# Patient Record
Sex: Female | Born: 1955
Health system: Southern US, Community
[De-identification: ages and names within clinical notes are randomized; demographics above are authoritative.]

## PROBLEM LIST (undated history)

## (undated) DIAGNOSIS — G8929 Other chronic pain: Secondary | ICD-10-CM

## (undated) DIAGNOSIS — I10 Essential (primary) hypertension: Secondary | ICD-10-CM

## (undated) DIAGNOSIS — M549 Dorsalgia, unspecified: Secondary | ICD-10-CM

## (undated) DIAGNOSIS — K589 Irritable bowel syndrome without diarrhea: Secondary | ICD-10-CM

## (undated) DIAGNOSIS — E785 Hyperlipidemia, unspecified: Secondary | ICD-10-CM

## (undated) DIAGNOSIS — M5020 Other cervical disc displacement, unspecified cervical region: Secondary | ICD-10-CM

## (undated) DIAGNOSIS — Z800 Family history of malignant neoplasm of digestive organs: Secondary | ICD-10-CM

## (undated) DIAGNOSIS — F419 Anxiety disorder, unspecified: Secondary | ICD-10-CM

## (undated) DIAGNOSIS — H269 Unspecified cataract: Secondary | ICD-10-CM

## (undated) DIAGNOSIS — K219 Gastro-esophageal reflux disease without esophagitis: Secondary | ICD-10-CM

## (undated) DIAGNOSIS — Z1211 Encounter for screening for malignant neoplasm of colon: Secondary | ICD-10-CM

## (undated) DIAGNOSIS — F32A Depression, unspecified: Secondary | ICD-10-CM

## (undated) HISTORY — PX: BACK SURGERY: SHX140

## (undated) HISTORY — PX: DENTAL SURGERY: SHX609

## (undated) HISTORY — DX: Essential (primary) hypertension: I10

## (undated) HISTORY — DX: Unspecified cataract: H26.9

## (undated) HISTORY — DX: Gastro-esophageal reflux disease without esophagitis: K21.9

## (undated) HISTORY — DX: Depression, unspecified: F32.A

## (undated) HISTORY — PX: COLONOSCOPY, DIAGNOSTIC (SCREENING): SHX174

## (undated) HISTORY — DX: Hyperlipidemia, unspecified: E78.5

## (undated) HISTORY — DX: Encounter for screening for malignant neoplasm of colon: Z12.11

## (undated) HISTORY — DX: Anxiety disorder, unspecified: F41.9

## (undated) HISTORY — DX: Family history of malignant neoplasm of digestive organs: Z80.0

---

## 1973-10-28 HISTORY — PX: OTHER SURGICAL HISTORY: SHX169

## 1999-10-29 HISTORY — PX: REDUCTION MAMMAPLASTY: SUR839

## 2006-02-13 ENCOUNTER — Other Ambulatory Visit: Admission: RE | Admit: 2006-02-13 | Discharge: 2006-02-13 | Payer: Self-pay | Admitting: Obstetrics & Gynecology

## 2006-08-04 ENCOUNTER — Emergency Department (HOSPITAL_COMMUNITY): Admission: EM | Admit: 2006-08-04 | Discharge: 2006-08-04 | Payer: Self-pay | Admitting: Emergency Medicine

## 2007-05-28 ENCOUNTER — Other Ambulatory Visit: Admission: RE | Admit: 2007-05-28 | Discharge: 2007-05-28 | Payer: Self-pay | Admitting: Obstetrics & Gynecology

## 2007-06-04 ENCOUNTER — Emergency Department (HOSPITAL_COMMUNITY): Admission: EM | Admit: 2007-06-04 | Discharge: 2007-06-04 | Payer: Self-pay | Admitting: Emergency Medicine

## 2008-07-14 ENCOUNTER — Other Ambulatory Visit: Admission: RE | Admit: 2008-07-14 | Discharge: 2008-07-14 | Payer: Self-pay | Admitting: Obstetrics & Gynecology

## 2009-11-23 ENCOUNTER — Ambulatory Visit: Payer: Self-pay | Admitting: Family Medicine

## 2011-08-12 LAB — DIFFERENTIAL
Basophils Absolute: 0
Basophils Relative: 1
Eosinophils Absolute: 0.1
Eosinophils Relative: 1
Neutrophils Relative %: 43

## 2011-08-12 LAB — CBC
HCT: 34.9 — ABNORMAL LOW
MCHC: 32.8
MCV: 79.1
Platelets: 288
RDW: 15.3 — ABNORMAL HIGH
WBC: 6.3

## 2011-08-12 LAB — POCT I-STAT CREATININE: Operator id: 294521

## 2011-08-12 LAB — I-STAT 8, (EC8 V) (CONVERTED LAB)
Acid-Base Excess: 5 — ABNORMAL HIGH
BUN: 13
Bicarbonate: 32 — ABNORMAL HIGH
HCT: 39
Hemoglobin: 13.3
Operator id: 294521
Sodium: 140
TCO2: 34
pCO2, Ven: 59.1 — ABNORMAL HIGH

## 2012-04-24 DIAGNOSIS — M5020 Other cervical disc displacement, unspecified cervical region: Secondary | ICD-10-CM | POA: Insufficient documentation

## 2013-06-11 DIAGNOSIS — Z5329 Procedure and treatment not carried out because of patient's decision for other reasons: Secondary | ICD-10-CM | POA: Insufficient documentation

## 2013-06-11 DIAGNOSIS — E7800 Pure hypercholesterolemia, unspecified: Secondary | ICD-10-CM | POA: Insufficient documentation

## 2013-06-11 DIAGNOSIS — E559 Vitamin D deficiency, unspecified: Secondary | ICD-10-CM | POA: Insufficient documentation

## 2013-06-11 DIAGNOSIS — H11119 Conjunctival deposits, unspecified eye: Secondary | ICD-10-CM | POA: Insufficient documentation

## 2013-06-11 DIAGNOSIS — N952 Postmenopausal atrophic vaginitis: Secondary | ICD-10-CM | POA: Insufficient documentation

## 2013-06-20 DIAGNOSIS — M549 Dorsalgia, unspecified: Secondary | ICD-10-CM | POA: Insufficient documentation

## 2013-07-23 DIAGNOSIS — N949 Unspecified condition associated with female genital organs and menstrual cycle: Secondary | ICD-10-CM | POA: Insufficient documentation

## 2013-09-29 ENCOUNTER — Other Ambulatory Visit: Payer: Self-pay | Admitting: Neurosurgery

## 2013-09-29 DIAGNOSIS — M5416 Radiculopathy, lumbar region: Secondary | ICD-10-CM

## 2013-09-29 DIAGNOSIS — M5412 Radiculopathy, cervical region: Secondary | ICD-10-CM

## 2013-10-08 ENCOUNTER — Ambulatory Visit
Admission: RE | Admit: 2013-10-08 | Discharge: 2013-10-08 | Disposition: A | Discharge status: Home / Self Care | Payer: BC Managed Care – PPO | Source: Ambulatory Visit | Attending: Neurosurgery | Admitting: Neurosurgery

## 2013-10-08 VITALS — BP 151/78 | HR 69

## 2013-10-08 DIAGNOSIS — M5412 Radiculopathy, cervical region: Secondary | ICD-10-CM

## 2013-10-08 DIAGNOSIS — M5416 Radiculopathy, lumbar region: Secondary | ICD-10-CM

## 2013-10-08 MED ORDER — MEPERIDINE HCL 100 MG/ML IJ SOLN
100.0000 mg | Freq: Once | INTRAMUSCULAR | Status: AC
  Administered 2013-10-08: 100 mg via INTRAMUSCULAR

## 2013-10-08 MED ORDER — DIAZEPAM 5 MG PO TABS
10.0000 mg | ORAL_TABLET | Freq: Once | ORAL | Status: AC
  Administered 2013-10-08: 10 mg via ORAL

## 2013-10-08 MED ORDER — ONDANSETRON HCL 4 MG/2ML IJ SOLN: 4.0000 mg | Freq: Four times a day (QID) | INTRAMUSCULAR | Status: DC | PRN

## 2013-10-08 MED ORDER — IOHEXOL 300 MG/ML  SOLN: 9.0000 mL | Freq: Once | INTRAMUSCULAR | Status: DC | PRN

## 2013-10-08 MED ORDER — ONDANSETRON HCL 4 MG/2ML IJ SOLN
4.0000 mg | Freq: Once | INTRAMUSCULAR | Status: AC
  Administered 2013-10-08: 4 mg via INTRAMUSCULAR

## 2013-10-08 NOTE — Progress Notes (Signed)
Pt is very anxious about myelogram, tried to reassure her that the procedure will go well and give her dr. the info he needs to help her.

## 2013-10-19 ENCOUNTER — Ambulatory Visit: Payer: BC Managed Care – PPO | Admitting: Family Medicine

## 2013-10-20 ENCOUNTER — Ambulatory Visit: Payer: BC Managed Care – PPO | Admitting: Family Medicine

## 2013-10-25 ENCOUNTER — Other Ambulatory Visit: Payer: Self-pay | Admitting: Neurosurgery

## 2013-10-25 DIAGNOSIS — M545 Low back pain: Secondary | ICD-10-CM

## 2013-10-27 ENCOUNTER — Ambulatory Visit
Admission: RE | Admit: 2013-10-27 | Discharge: 2013-10-27 | Disposition: A | Discharge status: Home / Self Care | Payer: BC Managed Care – PPO | Source: Ambulatory Visit | Attending: Neurosurgery | Admitting: Neurosurgery

## 2013-10-27 DIAGNOSIS — M545 Low back pain: Secondary | ICD-10-CM

## 2013-10-27 MED ORDER — IOHEXOL 180 MG/ML  SOLN
1.0000 mL | Freq: Once | INTRAMUSCULAR | Status: AC | PRN
  Administered 2013-10-27: 1 mL via EPIDURAL

## 2013-10-27 MED ORDER — METHYLPREDNISOLONE ACETATE 40 MG/ML INJ SUSP (RADIOLOG
120.0000 mg | Freq: Once | INTRAMUSCULAR | Status: AC
  Administered 2013-10-27: 120 mg via EPIDURAL

## 2013-12-08 ENCOUNTER — Other Ambulatory Visit: Payer: Self-pay | Admitting: Neurosurgery

## 2013-12-08 DIAGNOSIS — M545 Low back pain, unspecified: Secondary | ICD-10-CM

## 2013-12-11 ENCOUNTER — Encounter (HOSPITAL_COMMUNITY): Payer: Self-pay | Admitting: Emergency Medicine

## 2013-12-11 ENCOUNTER — Emergency Department (HOSPITAL_COMMUNITY): Payer: BC Managed Care – PPO

## 2013-12-11 ENCOUNTER — Emergency Department (HOSPITAL_COMMUNITY)
Admission: EM | Admit: 2013-12-11 | Discharge: 2013-12-11 | Disposition: A | Discharge status: Home / Self Care | Payer: BC Managed Care – PPO | Attending: Emergency Medicine | Admitting: Emergency Medicine

## 2013-12-11 DIAGNOSIS — G8929 Other chronic pain: Secondary | ICD-10-CM | POA: Insufficient documentation

## 2013-12-11 DIAGNOSIS — R42 Dizziness and giddiness: Secondary | ICD-10-CM | POA: Insufficient documentation

## 2013-12-11 DIAGNOSIS — R110 Nausea: Secondary | ICD-10-CM | POA: Insufficient documentation

## 2013-12-11 DIAGNOSIS — R0789 Other chest pain: Secondary | ICD-10-CM

## 2013-12-11 DIAGNOSIS — M549 Dorsalgia, unspecified: Secondary | ICD-10-CM | POA: Insufficient documentation

## 2013-12-11 DIAGNOSIS — Z79899 Other long term (current) drug therapy: Secondary | ICD-10-CM | POA: Insufficient documentation

## 2013-12-11 DIAGNOSIS — I10 Essential (primary) hypertension: Secondary | ICD-10-CM | POA: Insufficient documentation

## 2013-12-11 HISTORY — DX: Other chronic pain: G89.29

## 2013-12-11 HISTORY — DX: Essential (primary) hypertension: I10

## 2013-12-11 HISTORY — DX: Dorsalgia, unspecified: M54.9

## 2013-12-11 LAB — CBC
HCT: 38.3 % (ref 36.0–46.0)
Hemoglobin: 12.3 g/dL (ref 12.0–15.0)
MCH: 27.6 pg (ref 26.0–34.0)
MCHC: 32.1 g/dL (ref 30.0–36.0)
MCV: 86.1 fL (ref 78.0–100.0)
Platelets: 240 10*3/uL (ref 150–400)
RBC: 4.45 MIL/uL (ref 3.87–5.11)
RDW: 12.7 % (ref 11.5–15.5)
WBC: 6.2 10*3/uL (ref 4.0–10.5)

## 2013-12-11 LAB — BASIC METABOLIC PANEL
BUN: 11 mg/dL (ref 6–23)
CHLORIDE: 97 meq/L (ref 96–112)
CO2: 29 mEq/L (ref 19–32)
Calcium: 10 mg/dL (ref 8.4–10.5)
Creatinine, Ser: 0.95 mg/dL (ref 0.50–1.10)
GFR calc Af Amer: 75 mL/min — ABNORMAL LOW (ref >90)
GFR calc non Af Amer: 64 mL/min — ABNORMAL LOW (ref >90)
Glucose, Bld: 110 mg/dL — ABNORMAL HIGH (ref 70–99)
POTASSIUM: 3.5 meq/L — AB (ref 3.7–5.3)
SODIUM: 138 meq/L (ref 137–147)

## 2013-12-11 LAB — POCT I-STAT TROPONIN I: Troponin i, poc: 0 ng/mL (ref 0.00–0.08)

## 2013-12-11 MED ORDER — PROMETHAZINE HCL 25 MG PO TABS: 25.0000 mg | ORAL_TABLET | Freq: Four times a day (QID) | ORAL | Status: DC | PRN

## 2013-12-11 MED ORDER — MECLIZINE HCL 50 MG PO TABS: 25.0000 mg | ORAL_TABLET | Freq: Three times a day (TID) | ORAL | Status: DC | PRN

## 2013-12-11 MED ORDER — MECLIZINE HCL 25 MG PO TABS
25.0000 mg | ORAL_TABLET | Freq: Once | ORAL | Status: AC
  Administered 2013-12-11: 25 mg via ORAL
  Filled 2013-12-11: qty 1

## 2013-12-11 NOTE — ED Notes (Signed)
Pt ambulatory to restroom with no issues 
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
## 2013-12-11 NOTE — Discharge Instructions (Signed)
1. Medications: antivert, phenergan, usual home medications 2. Treatment: rest, drink plenty of fluids,  3. Follow Up: Please followup with your primary doctor for discussion of your diagnoses and further evaluation after today's visit;    Benign Positional Vertigo Vertigo means you feel like you or your surroundings are moving when they are not. Benign positional vertigo is the most common form of vertigo. Benign means that the cause of your condition is not serious. Benign positional vertigo is more common in older adults. CAUSES  Benign positional vertigo is the result of an upset in the labyrinth system. This is an area in the middle ear that helps control your balance. This may be caused by a viral infection, head injury, or repetitive motion. However, often no specific cause is found. SYMPTOMS  Symptoms of benign positional vertigo occur when you move your head or eyes in different directions. Some of the symptoms may include:  Loss of balance and falls.  Vomiting.  Blurred vision.  Dizziness.  Nausea.  Involuntary eye movements (nystagmus). DIAGNOSIS  Benign positional vertigo is usually diagnosed by physical exam. If the specific cause of your benign positional vertigo is unknown, your caregiver may perform imaging tests, such as magnetic resonance imaging (MRI) or computed tomography (CT). TREATMENT  Your caregiver may recommend movements or procedures to correct the benign positional vertigo. Medicines such as meclizine, benzodiazepines, and medicines for nausea may be used to treat your symptoms. In rare cases, if your symptoms are caused by certain conditions that affect the inner ear, you may need surgery. HOME CARE INSTRUCTIONS   Follow your caregiver's instructions.  Move slowly. Do not make sudden body or head movements.  Avoid driving.  Avoid operating heavy machinery.  Avoid performing any tasks that would be dangerous to you or others during a vertigo  episode.  Drink enough fluids to keep your urine clear or pale yellow. SEEK IMMEDIATE MEDICAL CARE IF:   You develop problems with walking, weakness, numbness, or using your arms, hands, or legs.  You have difficulty speaking.  You develop severe headaches.  Your nausea or vomiting continues or gets worse.  You develop visual changes.  Your family or friends notice any behavioral changes.  Your condition gets worse.  You have a fever.  You develop a stiff neck or sensitivity to light. MAKE SURE YOU:   Understand these instructions.  Will watch your condition.  Will get help right away if you are not doing well or get worse. Document Released: 07/22/2006 Document Revised: 01/06/2012 Document Reviewed: 07/04/2011 [facility] Patient Information 2014 [place], [place].

## 2013-12-11 NOTE — ED Notes (Signed)
Pt in c/o dizziness and feeling lightheaded that started after taking tramadol yesterday, states she has been taking a variety of medications over the last week to control sciatic pain without relief, but patient is here today due to feeling concerned about the dizziness. States symptoms have been constant since they started. Also c/o chest and back soreness, states she slept on the floor last night due to her back pain so she wasn't sure what caused it.

## 2013-12-11 NOTE — ED Notes (Signed)
Pt did not complain of dizziness or chest pain until RN arrived at bedside and asked many questions in triage, EKG obtained once complaint was made

## 2013-12-11 NOTE — ED Notes (Signed)
Pt returned from CT °

## 2013-12-12 NOTE — ED Provider Notes (Signed)
Medical screening examination/treatment/procedure(s) were performed by non-physician practitioner and as supervising physician I was immediately available for consultation/collaboration.  EKG Interpretation    Date/Time:  Saturday December 11 2013 18:21:35 EST Ventricular Rate:  84 PR Interval:  174 QRS Duration: 76 QT Interval:  370 QTC Calculation: 437 R Axis:   30 Text Interpretation:  Normal sinus rhythm Septal infarct , age undetermined No significant change since last tracing Confirmed by Anitra LauthPLUNKETT  MD, Pasco Marchitto ([id number]) on 12/11/2013 6:40:01 PM              Gwyneth SproutWhitney Kenitra Leventhal, MD 12/12/13 1207

## 2013-12-30 ENCOUNTER — Ambulatory Visit
Admission: RE | Admit: 2013-12-30 | Discharge: 2013-12-30 | Disposition: A | Discharge status: Home / Self Care | Payer: BC Managed Care – PPO | Source: Ambulatory Visit | Attending: Neurosurgery | Admitting: Neurosurgery

## 2013-12-30 ENCOUNTER — Other Ambulatory Visit: Payer: Self-pay | Admitting: Neurosurgery

## 2013-12-30 DIAGNOSIS — M545 Low back pain, unspecified: Secondary | ICD-10-CM

## 2013-12-30 MED ORDER — IOHEXOL 180 MG/ML  SOLN
1.0000 mL | Freq: Once | INTRAMUSCULAR | Status: AC | PRN
  Administered 2013-12-30: 1 mL via EPIDURAL

## 2013-12-30 MED ORDER — METHYLPREDNISOLONE ACETATE 40 MG/ML INJ SUSP (RADIOLOG
120.0000 mg | Freq: Once | INTRAMUSCULAR | Status: AC
  Administered 2013-12-30: 120 mg via EPIDURAL

## 2014-01-19 DIAGNOSIS — M5414 Radiculopathy, thoracic region: Secondary | ICD-10-CM | POA: Insufficient documentation

## 2014-01-19 DIAGNOSIS — Z789 Other specified health status: Secondary | ICD-10-CM | POA: Insufficient documentation

## 2014-01-19 DIAGNOSIS — M542 Cervicalgia: Secondary | ICD-10-CM | POA: Insufficient documentation

## 2014-01-19 DIAGNOSIS — G4700 Insomnia, unspecified: Secondary | ICD-10-CM | POA: Insufficient documentation

## 2014-01-19 DIAGNOSIS — M5417 Radiculopathy, lumbosacral region: Principal | ICD-10-CM

## 2014-01-19 DIAGNOSIS — Z6826 Body mass index (BMI) 26.0-26.9, adult: Secondary | ICD-10-CM | POA: Insufficient documentation

## 2014-02-14 ENCOUNTER — Other Ambulatory Visit: Payer: Self-pay | Admitting: Neurosurgery

## 2014-02-14 DIAGNOSIS — M545 Low back pain, unspecified: Secondary | ICD-10-CM

## 2014-02-24 ENCOUNTER — Other Ambulatory Visit: Payer: Self-pay | Admitting: Neurosurgery

## 2014-02-24 ENCOUNTER — Ambulatory Visit
Admission: RE | Admit: 2014-02-24 | Discharge: 2014-02-24 | Disposition: A | Discharge status: Home / Self Care | Payer: BC Managed Care – PPO | Source: Ambulatory Visit | Attending: Neurosurgery | Admitting: Neurosurgery

## 2014-02-24 DIAGNOSIS — M545 Low back pain, unspecified: Secondary | ICD-10-CM

## 2014-02-24 MED ORDER — METHYLPREDNISOLONE ACETATE 40 MG/ML INJ SUSP (RADIOLOG
120.0000 mg | Freq: Once | INTRAMUSCULAR | Status: AC
  Administered 2014-02-24: 120 mg via EPIDURAL

## 2014-02-24 MED ORDER — IOHEXOL 180 MG/ML  SOLN
1.0000 mL | Freq: Once | INTRAMUSCULAR | Status: AC | PRN
  Administered 2014-02-24: 1 mL via EPIDURAL

## 2014-05-17 DIAGNOSIS — Z0000 Encounter for general adult medical examination without abnormal findings: Secondary | ICD-10-CM | POA: Insufficient documentation

## 2014-07-12 DIAGNOSIS — I10 Essential (primary) hypertension: Secondary | ICD-10-CM | POA: Insufficient documentation

## 2014-09-19 DIAGNOSIS — R0683 Snoring: Secondary | ICD-10-CM | POA: Insufficient documentation

## 2014-10-03 DIAGNOSIS — J309 Allergic rhinitis, unspecified: Secondary | ICD-10-CM | POA: Insufficient documentation

## 2015-02-10 ENCOUNTER — Emergency Department (HOSPITAL_BASED_OUTPATIENT_CLINIC_OR_DEPARTMENT_OTHER): Payer: No Typology Code available for payment source

## 2015-02-10 ENCOUNTER — Encounter (HOSPITAL_BASED_OUTPATIENT_CLINIC_OR_DEPARTMENT_OTHER): Payer: Self-pay

## 2015-02-10 ENCOUNTER — Emergency Department (HOSPITAL_BASED_OUTPATIENT_CLINIC_OR_DEPARTMENT_OTHER)
Admission: EM | Admit: 2015-02-10 | Discharge: 2015-02-10 | Disposition: A | Discharge status: Home / Self Care | Payer: No Typology Code available for payment source | Attending: Emergency Medicine | Admitting: Emergency Medicine

## 2015-02-10 DIAGNOSIS — G8929 Other chronic pain: Secondary | ICD-10-CM | POA: Diagnosis not present

## 2015-02-10 DIAGNOSIS — Y9389 Activity, other specified: Secondary | ICD-10-CM | POA: Insufficient documentation

## 2015-02-10 DIAGNOSIS — Y92410 Unspecified street and highway as the place of occurrence of the external cause: Secondary | ICD-10-CM | POA: Insufficient documentation

## 2015-02-10 DIAGNOSIS — S199XXA Unspecified injury of neck, initial encounter: Secondary | ICD-10-CM | POA: Diagnosis present

## 2015-02-10 DIAGNOSIS — I10 Essential (primary) hypertension: Secondary | ICD-10-CM | POA: Diagnosis not present

## 2015-02-10 DIAGNOSIS — Y998 Other external cause status: Secondary | ICD-10-CM | POA: Diagnosis not present

## 2015-02-10 DIAGNOSIS — S46811A Strain of other muscles, fascia and tendons at shoulder and upper arm level, right arm, initial encounter: Secondary | ICD-10-CM

## 2015-02-10 DIAGNOSIS — Z79899 Other long term (current) drug therapy: Secondary | ICD-10-CM | POA: Diagnosis not present

## 2015-02-10 DIAGNOSIS — S46812A Strain of other muscles, fascia and tendons at shoulder and upper arm level, left arm, initial encounter: Secondary | ICD-10-CM | POA: Insufficient documentation

## 2015-02-10 MED ORDER — IBUPROFEN 800 MG PO TABS: 800.0000 mg | ORAL_TABLET | Freq: Three times a day (TID) | ORAL | Status: AC

## 2015-02-10 MED ORDER — CYCLOBENZAPRINE HCL 10 MG PO TABS
10.0000 mg | ORAL_TABLET | Freq: Two times a day (BID) | ORAL | Status: AC | PRN
Start: 2015-02-10 — End: ?
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
## 2015-02-10 NOTE — Discharge Instructions (Signed)
Take Ibuprofen and flexeril as needed for pain and muscle spasm. Refer to attached documents for more information. Return to the ED with worsening or concerning symptoms.

## 2015-03-30 DIAGNOSIS — G479 Sleep disorder, unspecified: Secondary | ICD-10-CM | POA: Insufficient documentation
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
## 2016-01-14 ENCOUNTER — Encounter (HOSPITAL_COMMUNITY): Payer: Self-pay | Admitting: Emergency Medicine

## 2016-01-14 ENCOUNTER — Emergency Department (HOSPITAL_COMMUNITY)
Admission: EM | Admit: 2016-01-14 | Discharge: 2016-01-14 | Disposition: A | Discharge status: Home / Self Care | Payer: BLUE CROSS/BLUE SHIELD | Attending: Emergency Medicine | Admitting: Emergency Medicine

## 2016-01-14 DIAGNOSIS — G8929 Other chronic pain: Secondary | ICD-10-CM | POA: Diagnosis not present

## 2016-01-14 DIAGNOSIS — I10 Essential (primary) hypertension: Secondary | ICD-10-CM | POA: Diagnosis not present

## 2016-01-14 DIAGNOSIS — Z79899 Other long term (current) drug therapy: Secondary | ICD-10-CM | POA: Insufficient documentation

## 2016-01-14 DIAGNOSIS — Z8719 Personal history of other diseases of the digestive system: Secondary | ICD-10-CM | POA: Diagnosis not present

## 2016-01-14 DIAGNOSIS — M545 Low back pain: Secondary | ICD-10-CM | POA: Diagnosis present

## 2016-01-14 DIAGNOSIS — M5410 Radiculopathy, site unspecified: Secondary | ICD-10-CM

## 2016-01-14 DIAGNOSIS — Z791 Long term (current) use of non-steroidal anti-inflammatories (NSAID): Secondary | ICD-10-CM | POA: Diagnosis not present

## 2016-01-14 DIAGNOSIS — M5416 Radiculopathy, lumbar region: Secondary | ICD-10-CM | POA: Diagnosis not present

## 2016-01-14 HISTORY — DX: Irritable bowel syndrome without diarrhea: K58.9

## 2016-01-14 HISTORY — DX: Other cervical disc displacement, unspecified cervical region: M50.20

## 2016-01-14 MED ORDER — PREDNISONE 50 MG PO TABS: ORAL_TABLET | ORAL | Status: DC

## 2016-01-14 MED ORDER — NAPROXEN 500 MG PO TABS: 500.0000 mg | ORAL_TABLET | Freq: Two times a day (BID) | ORAL | Status: AC

## 2016-01-14 MED ORDER — OXYCODONE-ACETAMINOPHEN 5-325 MG PO TABS: 1.0000 | ORAL_TABLET | ORAL | Status: AC | PRN

## 2016-01-14 MED ORDER — KETOROLAC TROMETHAMINE 60 MG/2ML IM SOLN
60.0000 mg | Freq: Once | INTRAMUSCULAR | Status: AC
  Administered 2016-01-14: 60 mg via INTRAMUSCULAR
  Filled 2016-01-14: qty 2

## 2016-01-14 NOTE — ED Notes (Signed)
Patient to remain in ED until 1015 due to IM injection.

## 2016-01-14 NOTE — ED Notes (Signed)
Pt states Friday her back pain began getting worse. Hx of sciatica, IBS, and slipped disks. States she didn't do anything to stress or pull her back. States the pain is on her right side/flank and radiates all the way into her right foot.

## 2016-01-14 NOTE — ED Provider Notes (Signed)
CSN: [id number]     Arrival date & time 01/14/16  0830 History   First MD Initiated Contact with Patient 01/14/16 [phone]     Chief Complaint  Patient presents with  . Back Pain     (Consider location/radiation/quality/duration/timing/severity/associated sxs/prior Treatment) HPI.Marland Kitchen.Marland Kitchen.Marland Kitchen.Right lower back pain with radiation to right leg and foot for 3 years, but getting worse since Friday.   She has been evaluated by a neurosurgeon and has been told she has a herniated disc 2. No bowel or bladder incontinence. Pain is worse with ambulation. Severity is moderate.  Past Medical History  Diagnosis Date  . Hypertension   . Back pain, chronic   . IBS (irritable bowel syndrome)   . Slipped cervical disc    History reviewed. No pertinent past surgical history. History reviewed. No pertinent family history. Social History  Substance Use Topics  . Smoking status: Never Smoker   . Smokeless tobacco: Never Used  . Alcohol Use: No   OB History    No data available     Review of Systems  All other systems reviewed and are negative.     Allergies  Review of patient's allergies indicates no known allergies.  Home Medications   Prior to Admission medications   Medication Sig Start Date End Date Taking? Authorizing Provider  cyclobenzaprine (FLEXERIL) 10 MG tablet Take 1 tablet (10 mg total) by mouth 2 (two) times daily as needed for muscle spasms. 02/10/15   Kaitlyn Szekalski, PA-C  hydrochlorothiazide (HYDRODIURIL) 12.5 MG tablet Take 12.5 mg by mouth daily.    Historical Provider, MD  ibuprofen (ADVIL,MOTRIN) 800 MG tablet Take 1 tablet (800 mg total) by mouth 3 (three) times daily. 02/10/15   Kaitlyn Szekalski, PA-C  meclizine (ANTIVERT) 50 MG tablet Take 0.5-1 tablets (25-50 mg total) by mouth 3 (three) times daily as needed for dizziness. 12/11/13   Hannah Muthersbaugh, PA-C  naproxen (NAPROSYN) 500 MG tablet Take 1 tablet (500 mg total) by mouth 2 (two) times daily. 01/14/16   Donnetta HutchingBrian Kyjuan Gause,  MD  ondansetron (ZOFRAN-ODT) 8 MG disintegrating tablet Take 8 mg by mouth every 8 (eight) hours as needed for nausea or vomiting.    Historical Provider, MD  oxyCODONE-acetaminophen (PERCOCET) 5-325 MG tablet Take 1-2 tablets by mouth every 4 (four) hours as needed. 01/14/16   Donnetta HutchingBrian Daray Polgar, MD  predniSONE (DELTASONE) 50 MG tablet 1 tablet for 6 days, one half tablet for 6 days 01/14/16   Donnetta HutchingBrian Nakyia Dau, MD  promethazine (PHENERGAN) 25 MG tablet Take 1 tablet (25 mg total) by mouth every 6 (six) hours as needed for nausea or vomiting. 12/11/13   Dahlia ClientHannah Muthersbaugh, PA-C  traMADol (ULTRAM) 50 MG tablet Take 50-100 mg by mouth every 6 (six) hours as needed for moderate pain.    Historical Provider, MD   BP 143/92 mmHg  Pulse 76  Temp(Src) 98 F (36.7 C) (Oral)  Resp 18  SpO2 100% Physical Exam  Constitutional: She is oriented to person, place, and time. She appears well-developed and well-nourished.  HENT:  Head: Normocephalic and atraumatic.  Eyes: Conjunctivae and EOM are normal. Pupils are equal, round, and reactive to light.  Neck: Normal range of motion. Neck supple.  Cardiovascular: Normal rate and regular rhythm.   Pulmonary/Chest: Effort normal and breath sounds normal.  Abdominal: Soft. Bowel sounds are normal.  Musculoskeletal:  Tender right lower back. Radicular pain to right foot.  Neurological: She is alert and oriented to person, place, and time.  Skin: Skin is warm and  dry.  Psychiatric: She has a normal mood and affect. Her behavior is normal.  Nursing note and vitals reviewed.   ED Course  Procedures (including critical care time) Labs Review Labs Reviewed - No data to display  Imaging Review No results found. I have personally reviewed and evaluated these images and lab results as part of my medical decision-making.   EKG Interpretation None      MDM   Final diagnoses:  Back pain with right-sided radiculopathy    I suspect patient has had a flare up of her  herniated nucleus pulposus. IM Toradol in the ED. Discharge medications Percocet, prednisone, Naprosyn 500 mg. Referral to neurosurgery.    Donnetta Hutching, MD 01/14/16 [phone]

## 2016-01-14 NOTE — Discharge Instructions (Signed)
I'm concerned that you have a herniated disc. Prescriptions for pain medicine, prednisone, anti-inflammatory medication. Follow-up with Dr. Jeral FruitBotero.  Ice pack to painful area.

## 2016-01-25 ENCOUNTER — Other Ambulatory Visit: Payer: Self-pay | Admitting: Neurosurgery

## 2016-01-25 DIAGNOSIS — G8929 Other chronic pain: Secondary | ICD-10-CM

## 2016-01-25 DIAGNOSIS — M545 Low back pain: Principal | ICD-10-CM

## 2016-01-26 ENCOUNTER — Ambulatory Visit
Admission: RE | Admit: 2016-01-26 | Discharge: 2016-01-26 | Disposition: A | Discharge status: Home / Self Care | Payer: BLUE CROSS/BLUE SHIELD | Source: Ambulatory Visit | Attending: Neurosurgery | Admitting: Neurosurgery

## 2016-01-26 VITALS — BP 128/83 | HR 63

## 2016-01-26 DIAGNOSIS — M5417 Radiculopathy, lumbosacral region: Principal | ICD-10-CM

## 2016-01-26 DIAGNOSIS — G8929 Other chronic pain: Secondary | ICD-10-CM

## 2016-01-26 DIAGNOSIS — M5414 Radiculopathy, thoracic region: Secondary | ICD-10-CM

## 2016-01-26 DIAGNOSIS — M545 Low back pain, unspecified: Secondary | ICD-10-CM

## 2016-01-26 MED ORDER — DIAZEPAM 5 MG PO TABS
10.0000 mg | ORAL_TABLET | Freq: Once | ORAL | Status: AC
  Administered 2016-01-26: 5 mg via ORAL

## 2016-01-26 MED ORDER — MEPERIDINE HCL 100 MG/ML IJ SOLN: 75.0000 mg | Freq: Once | INTRAMUSCULAR | Status: DC

## 2016-01-26 MED ORDER — ONDANSETRON HCL 4 MG/2ML IJ SOLN: 4.0000 mg | Freq: Once | INTRAMUSCULAR | Status: DC

## 2016-01-26 MED ORDER — IOHEXOL 180 MG/ML  SOLN
15.0000 mL | Freq: Once | INTRAMUSCULAR | Status: AC | PRN
  Administered 2016-01-26: 15 mL via INTRATHECAL

## 2016-01-26 NOTE — Discharge Instructions (Signed)

## 2016-01-27 HISTORY — PX: BACK SURGERY: SHX140

## 2016-05-09 DIAGNOSIS — F5101 Primary insomnia: Secondary | ICD-10-CM | POA: Insufficient documentation

## 2016-05-10 DIAGNOSIS — G4733 Obstructive sleep apnea (adult) (pediatric): Secondary | ICD-10-CM | POA: Insufficient documentation

## 2016-08-09 DIAGNOSIS — N200 Calculus of kidney: Secondary | ICD-10-CM | POA: Insufficient documentation

## 2016-08-09 DIAGNOSIS — K5909 Other constipation: Secondary | ICD-10-CM | POA: Insufficient documentation

## 2016-08-09 DIAGNOSIS — J301 Allergic rhinitis due to pollen: Secondary | ICD-10-CM | POA: Insufficient documentation

## 2016-08-09 DIAGNOSIS — K219 Gastro-esophageal reflux disease without esophagitis: Secondary | ICD-10-CM | POA: Insufficient documentation

## 2018-03-03 DIAGNOSIS — F329 Major depressive disorder, single episode, unspecified: Secondary | ICD-10-CM | POA: Insufficient documentation

## 2020-03-19 ENCOUNTER — Other Ambulatory Visit: Payer: Self-pay

## 2020-03-19 ENCOUNTER — Emergency Department (HOSPITAL_BASED_OUTPATIENT_CLINIC_OR_DEPARTMENT_OTHER)
Admission: EM | Admit: 2020-03-19 | Discharge: 2020-03-19 | Disposition: A | Discharge status: Home / Self Care | Payer: 59 | Attending: Emergency Medicine | Admitting: Emergency Medicine

## 2020-03-19 ENCOUNTER — Encounter (HOSPITAL_BASED_OUTPATIENT_CLINIC_OR_DEPARTMENT_OTHER): Payer: Self-pay | Admitting: Emergency Medicine

## 2020-03-19 DIAGNOSIS — Y9289 Other specified places as the place of occurrence of the external cause: Secondary | ICD-10-CM | POA: Insufficient documentation

## 2020-03-19 DIAGNOSIS — Z79899 Other long term (current) drug therapy: Secondary | ICD-10-CM | POA: Insufficient documentation

## 2020-03-19 DIAGNOSIS — Y999 Unspecified external cause status: Secondary | ICD-10-CM | POA: Insufficient documentation

## 2020-03-19 DIAGNOSIS — Y9389 Activity, other specified: Secondary | ICD-10-CM | POA: Diagnosis not present

## 2020-03-19 DIAGNOSIS — I10 Essential (primary) hypertension: Secondary | ICD-10-CM | POA: Diagnosis not present

## 2020-03-19 DIAGNOSIS — Z23 Encounter for immunization: Secondary | ICD-10-CM | POA: Insufficient documentation

## 2020-03-19 DIAGNOSIS — W268XXA Contact with other sharp object(s), not elsewhere classified, initial encounter: Secondary | ICD-10-CM | POA: Diagnosis not present

## 2020-03-19 DIAGNOSIS — S61412A Laceration without foreign body of left hand, initial encounter: Secondary | ICD-10-CM | POA: Diagnosis not present

## 2020-03-19 HISTORY — DX: Hyperlipidemia, unspecified: E78.5

## 2020-03-19 MED ORDER — TETANUS-DIPHTH-ACELL PERTUSSIS 5-2.5-18.5 LF-MCG/0.5 IM SUSP
0.5000 mL | Freq: Once | INTRAMUSCULAR | Status: AC
  Administered 2020-03-19: 0.5 mL via INTRAMUSCULAR
  Filled 2020-03-19: qty 0.5

## 2020-03-19 MED ORDER — LIDOCAINE HCL 2 % IJ SOLN
10.0000 mL | Freq: Once | INTRAMUSCULAR | Status: AC
  Administered 2020-03-19: 200 mg via INTRADERMAL
  Filled 2020-03-19: qty 20

## 2020-03-19 MED ORDER — ACETAMINOPHEN 325 MG PO TABS
650.0000 mg | ORAL_TABLET | Freq: Once | ORAL | Status: AC
  Administered 2020-03-19: 650 mg via ORAL
  Filled 2020-03-19: qty 2

## 2020-03-19 NOTE — Discharge Instructions (Signed)
Please follow-up for suture removal at either urgent care ,the emergency department, or your primary care doctor in 10-12 days.  Please return to the emergency room immediately if you experience any new or worsening symptoms or any symptoms that indicate worsening infection such as fevers, increased redness/swelling/pain, warmth, or drainage from the affected area.

## 2020-03-19 NOTE — ED Triage Notes (Signed)
Pt c/o 1.5 cm laceration between 1st and 2nd digit on left hand 3 hrs pta. Bandage applied pta, bleeding controlled. Unknown last tetanus

## 2020-03-19 NOTE — ED Provider Notes (Signed)
[facility] EMERGENCY DEPARTMENT Provider Note   CSN: [id number] Arrival date & time: 03/19/20  1127     History Chief Complaint  Patient presents with  . Extremity Laceration    hand    Sandy Stewart is a 64 y.o. female.  HPI   64 year old female with a history of chronic back pain, hyperlipidemia, hypertension, IBS, who presents the emergency department today for evaluation of a laceration on the left hand.  Patient states that she cut herself between the first and second digit on the palmar aspect of the left hand about 3 hours prior to arrival.  Bleeding is well controlled.  She has normal sensation and normal range of motion.  Pain is minimal.  Injury occurred suddenly.  She is not sure when her last Tdap was.  She denies any other injuries.  Past Medical History:  Diagnosis Date  . Back pain, chronic   . Hyperlipidemia   . Hypertension   . IBS (irritable bowel syndrome)   . Slipped cervical disc     Patient Active Problem List   Diagnosis Date Noted  . Dyssomnia 03/30/2015  . Allergic rhinitis 10/03/2014  . Snores 09/19/2014  . Essential (primary) hypertension 07/12/2014  . Encounter for general adult medical examination without abnormal findings 05/17/2014  . Body mass index (BMI) of 26.0-26.9 in adult 01/19/2014  . Cervical pain 01/19/2014  . Cannot sleep 01/19/2014  . Other specified health status 01/19/2014  . 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 Female genital symptoms 07/23/2013  . Back ache 06/20/2013  . Conjunctival deposit 06/11/2013  . Hypercholesterolemia 06/11/2013  . Atrophic vaginitis 06/11/2013  . Procedure refused 06/11/2013  . Avitaminosis D 06/11/2013    Past Surgical History:  Procedure Laterality Date  . BACK SURGERY       OB History   No obstetric history on file.     History reviewed. No pertinent family history.  Social History   Tobacco Use  . Smoking status: Never Smoker  . Smokeless tobacco: Never Used   Substance Use Topics  . Alcohol use: Yes  . Drug use: Never    Home Medications Prior to Admission medications   Medication Sig Start Date End Date Taking? Authorizing Provider  cyclobenzaprine (FLEXERIL) 10 MG tablet Take 1 tablet (10 mg total) by mouth 2 (two) times daily as needed for muscle spasms. 02/10/15   Emilia Beck, PA-C  hydrochlorothiazide (HYDRODIURIL) 25 MG tablet Take 25 mg by mouth daily. 12/28/15   [provider]  ibuprofen (ADVIL,MOTRIN) 800 MG tablet Take 1 tablet (800 mg total) by mouth 3 (three) times daily. 02/10/15   Szekalski, Kaitlyn, PA-C  naproxen (NAPROSYN) 500 MG tablet Take 1 tablet (500 mg total) by mouth 2 (two) times daily. 01/14/16   Donnetta Hutching, MD  omeprazole (PRILOSEC) 40 MG capsule TAKE 1 CAPSULE DAILY BEFORE BREAKFAST 10/30/15   [provider]  oxyCODONE-acetaminophen (PERCOCET) 5-325 MG tablet Take 1-2 tablets by mouth every 4 (four) hours as needed. Patient not taking: Reported on 01/26/2016 01/14/16   Donnetta Hutching, MD  pravastatin (PRAVACHOL) 20 MG tablet Take 20 mg by mouth at bedtime. 02/14/20   [provider]  traZODone (DESYREL) 50 MG tablet Take 50 mg by mouth. 05/18/15   [provider]    Allergies    Patient has no known allergies.  Review of Systems   Review of Systems  Constitutional: Negative for fever.  Musculoskeletal:       Left hand pain  Skin: Positive for wound.  Neurological: Negative for weakness and numbness.    Physical Exam Updated Vital Signs BP (!) 131/91 (BP Location: Right Arm)   Pulse 74   Temp 97.9 F (36.6 C) (Oral)   Resp 18   Ht 5\' 8"  (1.727 m)   Wt 86.2 kg   SpO2 99%   BMI 28.89 kg/m   Physical Exam Vitals and nursing note reviewed.  Constitutional:      General: She is not in acute distress.    Appearance: She is well-developed.  HENT:     Head: Normocephalic and atraumatic.  Eyes:     Conjunctiva/sclera: Conjunctivae normal.  Cardiovascular:     Rate  and Rhythm: Normal rate.  Pulmonary:     Effort: Pulmonary effort is normal.  Musculoskeletal:        General: Normal range of motion.     Cervical back: Neck supple.     Comments: 1.5 cm superficial laceration to the palmar aspect of the left hand. ROM of the fingers/hand fully intact. NVI distall.   Skin:    General: Skin is warm and dry.  Neurological:     Mental Status: She is alert.     ED Results / Procedures / Treatments   Labs (all labs ordered are listed, but only abnormal results are displayed) Labs Reviewed - No data to display  EKG None  Radiology No results found.  Procedures . Laceration Repair  Date/Time: 03/19/2020 2:38 PM Performed by: 03/21/2020, PA-C Authorized by: Karrie Meres, PA-C   Consent:    Consent obtained:  Verbal   Consent given by:  Patient   Risks discussed:  Infection and pain   Alternatives discussed:  No treatment Anesthesia (see MAR for exact dosages):    Anesthesia method:  Local infiltration   Local anesthetic:  Lidocaine 2% w/o epi Laceration details:    Location:  Hand   Hand location:  L palm   Length (cm):  1.5 Repair type:    Repair type:  Simple Pre-procedure details:    Preparation:  Patient was prepped and draped in usual sterile fashion Exploration:    Hemostasis achieved with:  Direct pressure   Wound exploration: wound explored through full range of motion and entire depth of wound probed and visualized     Wound extent: no foreign bodies/material noted, no muscle damage noted, no nerve damage noted, no tendon damage noted, no underlying fracture noted and no vascular damage noted     Contaminated: no   Treatment:    Area cleansed with:  Saline   Amount of cleaning:  Extensive   Irrigation solution:  Sterile saline   Irrigation method:  Pressure wash   Visualized foreign bodies/material removed: no   Skin repair:    Repair method:  Sutures   Suture size:  6-0   Suture material:  Prolene   Suture  technique:  Simple interrupted   Number of sutures:  3 Approximation:    Approximation:  Close Post-procedure details:    Patient tolerance of procedure:  Tolerated well, no immediate complications   (including critical care time)  Medications Ordered in ED Medications  lidocaine (XYLOCAINE) 2 % (with pres) injection 200 mg (200 mg Intradermal Given 03/19/20 1201)  Tdap (BOOSTRIX) injection 0.5 mL (0.5 mLs Intramuscular Given 03/19/20 1202)  acetaminophen (TYLENOL) tablet 650 mg (650 mg Oral Given 03/19/20 1202)    ED Course  I have reviewed the triage vital signs and the nursing notes.  Pertinent labs & imaging results that were available during my care of the patient were reviewed by me and considered in my medical decision making (see chart for details).    MDM Rules/Calculators/A&P                      Pressure irrigation performed. Wound explored and base of wound visualized in a bloodless field without evidence of foreign body.  Laceration occurred < 8 hours prior to repair which was well tolerated.  Tdap updated.  Pt has  no comorbidities to effect normal wound healing. Pt discharged  without antibiotics.  Discussed suture home care with patient and answered questions. Pt to follow-up for wound check and suture removal in 10-12 days; they are to return to the ED sooner for signs of infection. Pt is hemodynamically stable with no complaints prior to dc. .    Final Clinical Impression(s) / ED Diagnoses Final diagnoses:  Laceration of left hand, foreign body presence unspecified, initial encounter    Rx / DC Orders ED Discharge Orders    None       Bishop Dublin 03/19/20 1439    Lucrezia Starch, MD 03/22/20 1005

## 2020-03-31 ENCOUNTER — Emergency Department (HOSPITAL_BASED_OUTPATIENT_CLINIC_OR_DEPARTMENT_OTHER)
Admission: EM | Admit: 2020-03-31 | Discharge: 2020-03-31 | Disposition: A | Discharge status: Home / Self Care | Payer: 59 | Attending: Emergency Medicine | Admitting: Emergency Medicine

## 2020-03-31 ENCOUNTER — Encounter (HOSPITAL_BASED_OUTPATIENT_CLINIC_OR_DEPARTMENT_OTHER): Payer: Self-pay | Admitting: Emergency Medicine

## 2020-03-31 ENCOUNTER — Other Ambulatory Visit: Payer: Self-pay

## 2020-03-31 DIAGNOSIS — Z79899 Other long term (current) drug therapy: Secondary | ICD-10-CM | POA: Insufficient documentation

## 2020-03-31 DIAGNOSIS — X58XXXD Exposure to other specified factors, subsequent encounter: Secondary | ICD-10-CM | POA: Diagnosis not present

## 2020-03-31 DIAGNOSIS — I10 Essential (primary) hypertension: Secondary | ICD-10-CM | POA: Insufficient documentation

## 2020-03-31 DIAGNOSIS — S61412D Laceration without foreign body of left hand, subsequent encounter: Secondary | ICD-10-CM | POA: Diagnosis present

## 2020-03-31 DIAGNOSIS — Z4802 Encounter for removal of sutures: Secondary | ICD-10-CM

## 2020-03-31 NOTE — ED Notes (Signed)
Sutures removed from left hand.  3 removed.  No signs of infection noted.  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

## 2020-03-31 NOTE — Discharge Instructions (Addendum)
Continue to follow routine wound care as discussed.  If you develop redness, drainage, fever, return to ER for reassessment.

## 2020-03-31 NOTE — ED Triage Notes (Signed)
Suture removal to left hand.  No infection noted.

## 2020-04-01 NOTE — ED Provider Notes (Signed)
[facility] EMERGENCY DEPARTMENT Provider Note   CSN: [id number] Arrival date & time: 03/31/20  7619     History Chief Complaint  Patient presents with  . Suture / Staple Removal    Sandy Stewart is a 64 y.o. female.  Presenting to ER for suture removal.  Left hand laceration, 3 sutures placed.  Seems to be healing well, has not noted any signs or symptoms of infection such as drainage, redness.  No other new concerns.  HPI     Past Medical History:  Diagnosis Date  . Back pain, chronic   . Hyperlipidemia   . Hypertension   . IBS (irritable bowel syndrome)   . Slipped cervical disc     Patient Active Problem List   Diagnosis Date Noted  . Dyssomnia 03/30/2015  . Allergic rhinitis 10/03/2014  . Snores 09/19/2014  . Essential (primary) hypertension 07/12/2014  . Encounter for general adult medical examination without abnormal findings 05/17/2014  . Body mass index (BMI) of 26.0-26.9 in adult 01/19/2014  . Cervical pain 01/19/2014  . Cannot sleep 01/19/2014  . Other specified health status 01/19/2014  . 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 Female genital symptoms 07/23/2013  . Back ache 06/20/2013  . Conjunctival deposit 06/11/2013  . Hypercholesterolemia 06/11/2013  . Atrophic vaginitis 06/11/2013  . Procedure refused 06/11/2013  . Avitaminosis D 06/11/2013    Past Surgical History:  Procedure Laterality Date  . BACK SURGERY       OB History   No obstetric history on file.     No family history on file.  Social History   Tobacco Use  . Smoking status: Never Smoker  . Smokeless tobacco: Never Used  Substance Use Topics  . Alcohol use: Yes  . Drug use: Never    Home Medications Prior to Admission medications   Medication Sig Start Date End Date Taking? Authorizing Provider  cyclobenzaprine (FLEXERIL) 10 MG tablet Take 1 tablet (10 mg total) by mouth 2 (two) times daily as needed for muscle spasms. 02/10/15   Emilia Beck,  PA-C  hydrochlorothiazide (HYDRODIURIL) 25 MG tablet Take 25 mg by mouth daily. 12/28/15   [provider]  ibuprofen (ADVIL,MOTRIN) 800 MG tablet Take 1 tablet (800 mg total) by mouth 3 (three) times daily. 02/10/15   Szekalski, Kaitlyn, PA-C  naproxen (NAPROSYN) 500 MG tablet Take 1 tablet (500 mg total) by mouth 2 (two) times daily. 01/14/16   Donnetta Hutching, MD  omeprazole (PRILOSEC) 40 MG capsule TAKE 1 CAPSULE DAILY BEFORE BREAKFAST 10/30/15   [provider]  oxyCODONE-acetaminophen (PERCOCET) 5-325 MG tablet Take 1-2 tablets by mouth every 4 (four) hours as needed. Patient not taking: Reported on 01/26/2016 01/14/16   Donnetta Hutching, MD  pravastatin (PRAVACHOL) 20 MG tablet Take 20 mg by mouth at bedtime. 02/14/20   [provider]  traZODone (DESYREL) 50 MG tablet Take 50 mg by mouth. 05/18/15   [provider]    Allergies    Ace inhibitors  Review of Systems   Review of Systems  All other systems reviewed and are negative.   Physical Exam Updated Vital Signs BP (!) 143/85   Pulse 70   Temp 99 F (37.2 C) (Oral)   Resp 16   Ht 5\' 8"  (1.727 m)   Wt 86.2 kg   SpO2 97%   BMI 28.89 kg/m   Physical Exam Vitals and nursing note reviewed.  Constitutional:      Appearance: Normal appearance.  HENT:  Head: Normocephalic.     Nose: Nose normal.     Mouth/Throat:     Mouth: Mucous membranes are moist.  Eyes:     Extraocular Movements: Extraocular movements intact.  Cardiovascular:     Rate and Rhythm: Normal rate.     Pulses: Normal pulses.  Pulmonary:     Effort: Pulmonary effort is normal. No respiratory distress.  Musculoskeletal:     Cervical back: Normal range of motion. No rigidity.     Comments: R hand: laceration over palmar aspect of hand is healing well, incision intact, no numbness, normal flexion/extension, normal cap refill, normal radial pulse  Skin:    General: Skin is warm and dry.  Neurological:     General: No focal deficit  present.     Mental Status: She is alert.     Sensory: No sensory deficit.  Psychiatric:        Mood and Affect: Mood normal.        Behavior: Behavior normal.     ED Results / Procedures / Treatments   Labs (all labs ordered are listed, but only abnormal results are displayed) Labs Reviewed - No data to display  EKG None  Radiology No results found.  Procedures Procedures (including critical care time)  Medications Ordered in ED Medications - No data to display  ED Course  I have reviewed the triage vital signs and the nursing notes.  Pertinent labs & imaging results that were available during my care of the patient were reviewed by me and considered in my medical decision making (see chart for details).    MDM Rules/Calculators/A&P                      64 year old for suture removal.  Sutures removed by RN.  Wound appears to be healing well, no signs or symptoms to suggest infection.  Recommended routine wound care, return to ER as needed.    After the discussed management above, the patient was determined to be safe for discharge.  The patient was in agreement with this plan and all questions regarding their care were answered.  ED return precautions were discussed and the patient will return to the ED with any significant worsening of condition.     Final Clinical Impression(s) / ED Diagnoses Final diagnoses:  Visit for suture removal    Rx / DC Orders ED Discharge Orders    None       Lucrezia Starch, MD 04/01/20 [phone]

## 2021-05-24 ENCOUNTER — Ambulatory Visit (INDEPENDENT_AMBULATORY_CARE_PROVIDER_SITE_OTHER): Payer: Medicare Other | Admitting: Family Medicine

## 2021-05-28 ENCOUNTER — Ambulatory Visit (INDEPENDENT_AMBULATORY_CARE_PROVIDER_SITE_OTHER): Payer: Medicare Other | Admitting: Family Medicine

## 2021-05-28 ENCOUNTER — Ambulatory Visit (INDEPENDENT_AMBULATORY_CARE_PROVIDER_SITE_OTHER): Payer: Medicare Other | Admitting: Student

## 2021-05-28 VITALS — BP 130/80 | HR 52 | Temp 96.9°F | Ht 68.0 in | Wt 179.0 lb

## 2021-05-28 DIAGNOSIS — Z7689 Persons encountering health services in other specified circumstances: Secondary | ICD-10-CM

## 2021-05-28 DIAGNOSIS — I10 Essential (primary) hypertension: Secondary | ICD-10-CM

## 2021-05-28 DIAGNOSIS — Z1231 Encounter for screening mammogram for malignant neoplasm of breast: Secondary | ICD-10-CM

## 2021-05-28 DIAGNOSIS — Z13220 Encounter for screening for lipoid disorders: Secondary | ICD-10-CM

## 2021-05-28 DIAGNOSIS — F418 Other specified anxiety disorders: Secondary | ICD-10-CM

## 2021-05-28 MED ORDER — HYDROCHLOROTHIAZIDE 25 MG PO TABS
25.0000 mg | ORAL_TABLET | Freq: Every day | ORAL | 3 refills | Status: DC
Start: 2021-05-28 — End: 2022-01-21

## 2021-05-28 MED ORDER — DULOXETINE HCL 30 MG PO CPEP
30.0000 mg | ORAL_CAPSULE | Freq: Every day | ORAL | 3 refills | Status: DC
Start: 2021-05-28 — End: 2021-06-29

## 2021-05-28 NOTE — Progress Notes (Signed)
[facility]    [address]  [place], [place] [address]    [phone]                       Date of Exam: 05/28/2021 10:04 AM        Patient ID: Megan White is a 65 y.o. female.  Attending Physician: Merton Border, DO        Reason for visit:    Patient is here today to follow up on issues, including:   Chief Complaint   Patient presents with    Establish Care     Pt reports needing more hydrochlorothiazide or new BP medication            Assessment:    1. Encounter to establish care    2. Screening for hyperlipidemia    3. Encounter for screening mammogram for malignant neoplasm of breast    4. Depression with anxiety  - DULoxetine (CYMBALTA) 30 MG capsule; Take 1 capsule (30 mg total) by mouth daily  Dispense: 30 capsule; Refill: 3    5. Benign essential hypertension  - hydroCHLOROthiazide (HYDRODIURIL) 25 MG tablet; Take 1 tablet (25 mg total) by mouth daily  Dispense: 90 tablet; Refill: 3          Plan:    Patient Instructions   Dear Megan White,    It was good to see you in clinic today. We discussed your chronic medical conditions and concerns for today.     It seems that your hypertension is well controlled.  I have refilled your hydrochlorothiazide at the same dose that you are on previously.  Continue taking your pravastatin for your hyperlipidemia and we can check a cholesterol panel at your annual wellness visit in a few weeks.    For your depression and anxiety, we discussed that your previous provider recommended starting Cymbalta as this can be helpful for both mood symptoms as well as chronic pain.  As you received a prescription but had not filled it yet in that area, I have sent a new prescription for you to fill here.  I do think it would be worth trying this medication, as your scores for depression and anxiety severity were elevated today.  I understand that you sometimes have worsening constipation with medication such as this, if that happens let  us know and we can treat your constipation.  Often, when starting medication like Cymbalta, you have more side effects in the first 2 weeks and then the taper off.  Typically, the medication will reach full therapeutic effect in 4 weeks.  I recommend following up in that time.    If I have ordered labs or imaging, know that you will receive the results immediately when it is available. I may not see the results before you do; if there is nothing critical, expect my interpretation in 1-2 business days through MyChart.     Follow-up in the next 4 weeks for a Medicare annual wellness visit, we can address labs and imaging results then.    Take care,    Dr. Gilford Rile             Follow-up:    Return in about 4 weeks (around 06/25/2021) for follow up on Cymbalta initiation, Medicare AWV.             HPI/ROS:    Megan White presents today for: establish care    Patient recently  moved from [place] to the [place] area and is establishing care with our clinic.  Her last wellness visit was reportedly in May of this year-she notes she is due for mammogram, received COVID booster and pneumonia vaccine at that time.  Colonoscopy up-to-date, last in 2018 or 2019.  Patient does have family history of colon cancer (metastasis from breast cancer) and has been undergoing colonoscopy every 5 years.  She has had 1 abnormal mammogram result years ago resulting in a biopsy without concern for malignancy.  Both her mother and grandmother had breast cancer in their 26s.  Regarding cervical cancer screening, patient has had largely normal Pap results except for a cervical polyp discovered years ago that has been monitored without significant changes.  She had a normal DEXA scan last year.    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        Vitals/PE:    BP 130/80   Pulse (!) 52   Temp (!) 96.9 F (36.1 C)   Ht 1.727 m (5\' 8" )   Wt 81.2 kg (179 lb)   BMI 27.22 kg/m      Physical Exam  General: NAD. Nontoxic appearance.  Pulmonary: Respiratory effort normal. Normal RR. CTAB.  Cardio: RRR. No m/r/g.  Abdomen: Soft and flat. No TTP.  Neuro: Normal speech. Orientation is appropriate. No gross neurologic deficits.  Skin: No rashes.     The ASCVD Risk score (Arnett DK, et al., 2019) failed to calculate for the following reasons:    Cannot find a previous HDL lab    Cannot find a previous total cholesterol lab    The smoking status is invalid    We reviewed previous labs and those ordered in anticipation of this visit today during the visit.       No results found for: WBC, HGB, HCT, PLT, CHOL, TRIG, HDL, LDL, ALT, AST, NA, K, CL, CREAT, BUN, CO2, TSH, PSA, INR, GLU, HGBA1C            Problem List:    There is no problem list on file for this patient.            Current Meds:    Outpatient Medications Marked as Taking for the 05/28/21 encounter (Office Visit) with Merton Border, DO   Medication Sig Dispense Refill    B Complex Vitamins (Vitamin B-Complex) Tab Take 1 tablet by mouth daily      Cholecalciferol 25 MCG (1000 UT) capsule Take 1,000 Units  by mouth       diphenhydrAMINE-acetaminophen (TYLENOL PM) 25-500 MG Tab Take 1 tablet by mouth nightly as needed      melatonin 1 mg tablet Take 2 mg by mouth      polyethylene glycol (MIRALAX) 17 GM/SCOOP powder Take by mouth      pravastatin (PRAVACHOL) 40 MG tablet       [DISCONTINUED] DULoxetine (CYMBALTA) 30 MG capsule       [DISCONTINUED] hydroCHLOROthiazide (HYDRODIURIL) 25 MG tablet             Allergies:    Allergies   Allergen Reactions    Oxycodone Dizziness    Ace Inhibitors Cough and Other (See Comments)     cough  cough             Past Surgical History:    No past surgical history on file.        Family History:    No family history on file.        Social History:             The following sections were reviewed this encounter by the provider:             Signed by:   Merton Border, DO  Family Medicine PGY-2

## 2021-05-31 ENCOUNTER — Encounter (INDEPENDENT_AMBULATORY_CARE_PROVIDER_SITE_OTHER): Payer: Self-pay | Admitting: Family Medicine

## 2021-05-31 NOTE — Addendum Note (Signed)
Addended by: Doyle Askew on: 05/31/2021 02:05 PM     Modules accepted: Level of Service

## 2021-05-31 NOTE — Progress Notes (Signed)
Patient discussed with Merton Border, DO  concurrently with the patient's visit. Chart reviewed to include allergies, problems, current medications and family, social and past medical and surgical histories.  I reviewed the history, review of systems and physical exam as outlined in [facility], DO's note and I agree with the findings as outlined in her note.  We discussed the assessment and plan for the encounter in detail and I concur with the assessment and plan as per Merton Border, DO .

## 2021-05-31 NOTE — Patient Instructions (Addendum)
Dear [name],    It was good to see you in clinic today. We discussed your chronic medical conditions and concerns for today.     It seems that your hypertension is well controlled.  I have refilled your hydrochlorothiazide at the same dose that you are on previously.  Continue taking your pravastatin for your hyperlipidemia and we can check a cholesterol panel at your annual wellness visit in a few weeks.    For your depression and anxiety, we discussed that your previous provider recommended starting Cymbalta as this can be helpful for both mood symptoms as well as chronic pain.  As you received a prescription but had not filled it yet in that area, I have sent a new prescription for you to fill here.  I do think it would be worth trying this medication, as your scores for depression and anxiety severity were elevated today.  I understand that you sometimes have worsening constipation with medication such as this, if that happens let us know and we can treat your constipation.  Often, when starting medication like Cymbalta, you have more side effects in the first 2 weeks and then the taper off.  Typically, the medication will reach full therapeutic effect in 4 weeks.  I recommend following up in that time.    If I have ordered labs or imaging, know that you will receive the results immediately when it is available. I may not see the results before you do; if there is nothing critical, expect my interpretation in 1-2 business days through MyChart.     Follow-up in the next 4 weeks for a Medicare annual wellness visit, we can address labs and imaging results then.    Take care,    Dr. Gilford Rile

## 2021-06-01 ENCOUNTER — Ambulatory Visit (INDEPENDENT_AMBULATORY_CARE_PROVIDER_SITE_OTHER): Payer: Medicare Other | Admitting: Family Medicine

## 2021-06-29 ENCOUNTER — Encounter (INDEPENDENT_AMBULATORY_CARE_PROVIDER_SITE_OTHER): Payer: Self-pay | Admitting: Family Medicine

## 2021-06-29 ENCOUNTER — Ambulatory Visit (INDEPENDENT_AMBULATORY_CARE_PROVIDER_SITE_OTHER): Payer: Medicare Other | Admitting: Family Medicine

## 2021-06-29 VITALS — BP 116/74 | HR 72 | Resp 18 | Ht 68.0 in | Wt 180.0 lb

## 2021-06-29 DIAGNOSIS — F418 Other specified anxiety disorders: Secondary | ICD-10-CM

## 2021-06-29 DIAGNOSIS — K581 Irritable bowel syndrome with constipation: Secondary | ICD-10-CM

## 2021-06-29 DIAGNOSIS — Z1159 Encounter for screening for other viral diseases: Secondary | ICD-10-CM

## 2021-06-29 DIAGNOSIS — Z1231 Encounter for screening mammogram for malignant neoplasm of breast: Secondary | ICD-10-CM

## 2021-06-29 DIAGNOSIS — R7301 Impaired fasting glucose: Secondary | ICD-10-CM

## 2021-06-29 DIAGNOSIS — Z0000 Encounter for general adult medical examination without abnormal findings: Secondary | ICD-10-CM

## 2021-06-29 DIAGNOSIS — Z23 Encounter for immunization: Secondary | ICD-10-CM

## 2021-06-29 DIAGNOSIS — E7800 Pure hypercholesterolemia, unspecified: Secondary | ICD-10-CM

## 2021-06-29 NOTE — Progress Notes (Signed)
[facility] [place] [facility] [place] [facility]                       Date of Exam: 06/29/2021 1:32 PM        Patient ID: Megan White is a 65 y.o. female.  Attending Physician: Merton Border, DO        Reason for visit:    Patient is here today for a Medicare Wellness visit.     Medicare AWV Note:    LEGAL DOCUMENTS and CODE STATUS:   Discussion of Advance Directives: Has an Advanced Directive. A copy has not been provided. Requested to provide.        Health Risk Assessment:     During the past month, how would you rate your general health?:  Good  Which of the following tasks can you do without assistance - drive or take the bus alone; shop for groceries or clothes; prepare your own meals; do your own housework/laundry; handle your own finances/pay bills; eat, bathe or get around your home?:  Do your own housework/laundry, Drive or take the bus alone, Shop for groceries or clothes, Handle your own finances/pay bills, Prepare your own meals, Eat, bathe, dress or get around your home  Which of the following problems have you been bothered by in the past month - dizzy when standing up; problems using the phone; feeling tired or fatigued; moderate or severe body pain?: Moderate or severe body pain  Do you exercise for about 20 minutes 3 or more days per week?:  Yes  During the past month was someone available to help if you needed and wanted help?  For example, if you felt nervous, lonely, got sick and had to stay in bed, needed someone to talk to, needed help with daily chores or needed help just taking care of yourself.: Yes  Do you always wear a seat belt?: Yes  Do you have any trouble taking medications the way you have been told to take them?: No  Have you been given any information that can help you with keeping track of your medications?: Yes  Do you have trouble paying for your medications?: No  Have you been given any information that can help you with hazards in your house, such as scatter  rugs, furniture, etc?: Yes  Do you feel unsteady when standing or walking?: No  Do you worry about falling?: No  Have you fallen two or more times in the past year?: No  Did you suffer any injuries from your falls in the past year?: No      FOR WELCOME TO MEDICARE ONLY:   Vision Screening (required for IPPE only): Not performed  Screening EKG Not indicated      Hospitalizations:   no hospitalizations within past year      Depression Screening:     Depression: At risk    PHQ-2 Score: 3          Functional Assessment:   Hearing:  hearing within normal limits  Exercise: Moderate ( i.e. brisk walking ), exercises 3-4x/week, and 30-60 minutes per day    ADL's:   Dressing - independent   Mobility - independent   Transfer - independent   Toileting - independent   If needed, ADL assistance provided by not needed      Cognitive Function:     Mood/affect: Appropriate  Appearance: neatly groomed, appropriately and adequately nourished  Family member/caregiver input: Not present  Mini-Cog Instructions:     Step 1: Three word recall      Banana, Sunrise, Chair    Step 2: Draw a clock to read the time 10:50    Step 3: Recall three words      AWV Mini-Cog Result:  > 3 points - negative screen for dementia            Care Team:    Patient Care Team:  Mangini, Laural Benes, MD as PCP - General (Residents)          Problem List:    Patient Active Problem List   Diagnosis    Vitamin D deficiency    Obstructive sleep apnea hypopnea, moderate    Major depressive disorder    Kidney stone    Hypercholesterolemia    Herniated cervical disc    Primary insomnia    Chronic constipation    Benign essential hypertension    Allergic rhinitis due to pollen    Acid reflux             Current Meds:    Outpatient Medications Marked as Taking for the 06/29/21 encounter (Office Visit) with Merton Border, DO   Medication Sig Dispense Refill    diphenhydrAMINE-acetaminophen (TYLENOL PM) 25-500 MG Tab Take 1 tablet by mouth nightly as needed       hydroCHLOROthiazide (HYDRODIURIL) 25 MG tablet Take 1 tablet (25 mg total) by mouth daily 90 tablet 3    polyethylene glycol (MIRALAX) 17 GM/SCOOP powder Take by mouth      pravastatin (PRAVACHOL) 40 MG tablet             Allergies:    Allergies   Allergen Reactions    Oxycodone Dizziness    Ace Inhibitors Cough and Other (See Comments)     cough  cough               Past Surgical History/Past Medical History:    History reviewed. No pertinent surgical history.    History reviewed. No pertinent past medical history.        Family History:    History reviewed. No pertinent family history.        Social History:    Social History     Tobacco Use    Smoking status: Never    Smokeless tobacco: Never           The following sections were reviewed this encounter by the provider:            Vital Signs:      Resp 18   Ht 1.727 m (5\' 8" )   Wt 81.6 kg (180 lb)   BMI 27.37 kg/m            Merton Border, DO

## 2021-06-29 NOTE — Progress Notes (Signed)
[facility]    [address]  [place], [place] [address]    [phone]                       Date of Exam: 06/29/2021 8:11 PM        Patient ID: Megan White is a 65 y.o. female.  Attending Physician: Merton Border, DO        Reason for visit:    Patient is here today to follow up on issues, including:   Chief Complaint   Patient presents with    Medicare Annual Wellness Visit - Subsequent              Assessment & Plan:    1. Medicare annual wellness visit, subsequent    2. Immunization due  - Flu vaccine HIGH-DOSE QUAD (PF) 65 yrs and older    3. Irritable bowel syndrome with constipation  - Discussed use of Peppermint oil as an anti-spasmotic  - Discussed FODMAP diet and maintaining a diet journal    4. Depression with anxiety  - Discontinued Cymbalta due to adverse effects; resources provided for mental health referral     5. Encounter for screening mammogram for malignant neoplasm of breast  - Mammo Digital Screening Bilateral W Cad    6. Hypercholesterolemia  - Lipid panel    7. Impaired fasting glucose  - Hemoglobin A1C    8. Need for hepatitis C screening test  - Hepatitis C (HCV) antibody, Total          Follow-up:    Return in about 3 months (around 09/28/2021) for back pain and IBS follow up .               HPI/ROS:    Megan White presents today for: Medicare annual wellness and chronic follow up    #Medicare annual wellness  - Exam completed, see nursing note  - Up to date on Dexa and colonoscopy; due for mammogram   - Colonoscopy due 2023-2024   - DEXA normal last year     #Mixed depression and anxiety  - Started Cymbalta, discontinued due to adverse effects   - Started having headaches, burning gastric pain, feeling hungry   - Multiple family losses, recent move, stressors are improving   - Exercise helping, decreased watching the news     #Irritable bowel syndrome  - Associated with constipation, gas, and abdominal pain intermittently   - On Linzess in the  past with some improvement, was previously followed by GI  - Unsure about dietary triggers    ROS  Please see HPI for pertinent ROS including timing and descriptors.        Vitals/PE:    BP 116/74   Pulse 72   Resp 18   Ht 1.727 m (5\' 8" )   Wt 81.6 kg (180 lb)   BMI 27.37 kg/m      Physical Exam  General: NAD. Nontoxic appearance.  Pulmonary: Respiratory effort normal. Normal RR. CTAB.  Cardio: RRR. No m/r/g.  Abdomen: Soft and flat. No TTP.  Neuro: Normal speech. Orientation is appropriate. No gross neurologic deficits.            Problem List:    Patient Active Problem List   Diagnosis    Vitamin D deficiency    Obstructive sleep apnea hypopnea, moderate    Major depressive disorder    Kidney stone    Hypercholesterolemia  Herniated cervical disc    Primary insomnia    Chronic constipation    Benign essential hypertension    Allergic rhinitis due to pollen    Acid reflux               Current Meds:    Outpatient Medications Marked as Taking for the 06/29/21 encounter (Office Visit) with Merton Border, DO   Medication Sig Dispense Refill    diphenhydrAMINE-acetaminophen (TYLENOL PM) 25-500 MG Tab Take 1 tablet by mouth nightly as needed      hydroCHLOROthiazide (HYDRODIURIL) 25 MG tablet Take 1 tablet (25 mg total) by mouth daily 90 tablet 3    polyethylene glycol (MIRALAX) 17 GM/SCOOP powder Take by mouth      pravastatin (PRAVACHOL) 40 MG tablet             Allergies:    Allergies   Allergen Reactions    Oxycodone Dizziness    Ace Inhibitors Cough and Other (See Comments)     cough  cough             Past Surgical History:    History reviewed. No pertinent surgical history.        Family History:    History reviewed. No pertinent family history.        Social History:    Social History     Tobacco Use    Smoking status: Never    Smokeless tobacco: Never           The following sections were reviewed this encounter by the provider:             Signed by:   Merton Border, DO  Family Medicine PGY-2

## 2021-06-29 NOTE — Patient Instructions (Addendum)
MEDICARE WELLNESS PERSONAL PREVENTION PLAN   As part of the Medicare Wellness portion of your visit today, we are providing you with this personalized preventative plan of care. We have listed below some of the preventative services that are recommended for patients based upon their age and gender. These recommendations are taken directly from the [place] [employer] (USPSTF) and the [employer] on [employer] (ACIP).     Health Maintenance   Topic Date Due    Colorectal Cancer Screening  Never done    Advance Directive on File  Never done    MAMMOGRAM  Never done    DXA Scan  Never done    Medicare Annual Wellness Visit  Never done    Shingrix Vaccine 50+ (1) Never done    HEPATITIS C SCREENING  Never done    INFLUENZA VACCINE  05/28/2021    FALLS RISK ANNUAL  05/31/2022    DEPRESSION SCREENING  05/31/2022    Tetanus Ten-Year  03/19/2030    COVID-19 Vaccine  Completed    Pneumonia Vaccine Age 36+  Completed       Health Maintenance Topics with due status: Overdue       Topic Date Due    Colorectal Cancer Screening Never done    Advance Directive on File Never done    MAMMOGRAM Never done    DXA Scan Never done    Medicare Annual Wellness Visit Never done    Shingrix Vaccine 50+ Never done    HEPATITIS C SCREENING Never done    INFLUENZA VACCINE 05/28/2021        Immunization History   Administered Date(s) Administered    COVID-19 mRNA vaccine 12 years and above [employer]) 30 mcg/0.3 mL 01/05/2020, 01/26/2020, 08/09/2020    COVID-19 mRNA vaccine Tris-Sucrose 12 years and above [employer]) 30 mcg/0.3 mL 03/22/2021    Influenza quadrivalent (IM) PF 3 Yrs & greater 07/19/2014, 12/18/2017, 08/28/2018    Pneumococcal 23 valent 03/22/2021    Pneumococcal Conjugate 13-Valent 09/21/2019    Tdap 11/21/2010, 03/19/2020        Your major risk factors: Hypertension  Recommendations for improvement:  Exercise - Engage in age appropriate exercise 150 minutes per week    Hypertension - Monitor your blood pressure daily. Here is a good reference website for tips on managing hypertension - MotivationalSites.no  Falls Prevention - Here is a good reference website for tips on avoiding falls - BikingRewards.pl  Compliance with prescription medications    The list below includes many common screening recommendations but is not meant to be comprehensive. You may be eligible for other preventative services depending upon your personal risk factors.   Colorectal Cancer Screening - All adults age 55-75 should undergo periodic colorectal cancer screening. The decision to screen for colorectal cancer in adults aged 22 to 63 years should be an individual one,taking into account your overall health and prior screening history.   Breast Cancer Screening - Women age 35-74 should have mammograms every other year (please note that this recommendation may not be appropriate for every woman - your physician can answer specific questions you may have). The USPSTF concludes that the current evidence is insufficient to assess the balance of benefits and harms of screening mammography in women aged 83 years or older.    Cervical Cancer Screening - Women over 37 do not require pap smears as long as prior screening has been normal and are not otherwise at high risk  for cervical cancer. For women aged 101 to 39 years, the USPSTF recommends screening every 3 years with cervical cytology alone, every 5 years with high-risk human papillomavirus (hrHPV) testing alone, or every 5 years with hrHPV testing in combination with cytology (cotesting).   Osteoporosis Screening -  The USPSTF recommends screening for osteoporosis with bone measurement testing to prevent osteoporotic fractures in women 65 years and older.  Hepatitis C Screening - Recommend screening for hepatitis C virus (HCV) infection in all adults aged 30 to 71 years.  Lung  cancer Screening - Recommend annual screening for lung cancer with low-dose computed tomography (LDCT) in adults ages 66 to 76 years who have a 20 pack-year smoking history and currently smoke or have quit within the past 15 years.  Recommended Vaccinations   Influenza one dose annually   Tetanus/diphtheria one booster every 10 years   Zoster/Shingles - Shingrix two doses after age 61 (second dose given 2-6 months after first dose)  Pneumovax (PPSV23) one dose for adults aged ?65 years  Prevnar(PCV13) shared clinical decision-making is recommended regarding administration of this vaccine to persons aged ?65 years who do not have an immunocompromising condition, cerebrospinal fluid leak, or cochlear implant.     PERSONAL PREVENTION PLAN   Your Personal Prevention Plan is based on your overall health and your responses to the health questionnaire you completed. The following information is for you to review in addition to the recommendations, referrals, and tests we have discussed at your visit.     Physical Activity:   Physical activity can help you maintain a healthy weight, prevent or control illness, reduce stress, and sleep better. It can also help you improve your balance to avoid falls. Try to build up to and maintain a total of 30 minutes of activity each day. If you are able, try walking, doing yard or housework, and taking the stairs more often. You can also strengthen your muscles with exercises done while sitting or lying down.   Emotional Health:   Feeling "down in the dumps" or anxious every now and then is a natural part of life. If this feeling lasts for a few weeks or more, talk with me as soon as possible. It could be a sign of a problem that needs treatment. There are many types of treatment available.   Falls:   You can reduce your risk of falling by making changes in your home. Remove items that may cause tripping, improve lighting, and consider installing grab bars.   Talk with me if you have  problems with balance and walking. To prevent falls, you may need your vision, hearing, or blood pressure checked. Exercises to improve your strength and balance, or using a cane or walker, may help. Review your medicines with me at every visit, because some can affect balance. Please be sure to let me know if you fall or are fearful you may fall.   Urinary Leakage:   Urine leakage is common, but it is not a normal part of aging. Talk with me about any urine leakage so that the cause can be found and treated. Treatment can include bladder training, exercises, medicine or surgery.   Pain:   We all have aches and pains at times, but chronic pain can change how you feel and live every day. Please talk with me about any symptoms of chronic pain so that we can determine how best to treat.   Sleep:   Getting a good night's sleep is vital  to your health and well-being and can help prevent or manage health problems. Often, sleep can be improved by changing behaviors, including when you go to bed and what you do before bed. Sleep apnea can cause problems such as struggling to stay awake during the day. Please let me know if you would like to learn more about improving your sleep and/or think you may have sleep apnea.   Seat Belt:   Please remember to wear a seat belt when driving or riding in a vehicle. It is one of the most important things you can do to stay safe in a car.   Nutrition:   Remember to eat plenty of fruits, vegetables, whole grains, and dairy. Drink at least 64 ounces (8 full glasses) of water a day, unless you have been advised to limit fluids.   Alcohol:   Alcohol can have a greater effect on older people, who may feel its effects at a lower amount. Older people should limit alcoholic drinks (no more than one a day for women and no more than two a day for men). Please let me know if alcohol use becomes a problem.   Tobacco:   Not smoking or using other forms of tobacco is one of the most important things  you can do for your health. Here is some more information about the importance of quitting smoking and how to quit smoking - BroadJournal.com.pt  Advance Directives:   There may come a time when medical decisions need to be made on your behalf. Please talk with your family, and with me, about your wishes. It is important to provide information about your decisions, and any formal advance directives, for your medical record. Here is additional information on advanced directives - http://wilson-mayo.com/.html  Additional Support:   Sometimes it can be challenging to manage all aspects of daily life. Finding the right support can help you maintain or improve your health and independence. Please let me know if you would like to talk further about finding resources to assist you.     The [facility] facilities that we use are:    [facility] for [employer]  Phone: [phone]  Address:  [address] [place], [place] [address]    [facility]  Phone: [phone]  Address:  [address]  [address] [place], [place] [address]    [facility]  Call [phone]    Use this web-site (https://www.psychologytoday.com/us) to further look for different therapists in your area.    If you have any thoughts of hurting yourself, you should seek help, either by calling our office (we always have people on-call), or calling the national suicide hotline @ [phone]     Resources for counseling/therapy:  https://houston.com/ - online service that provides teletherapy sessions  www.psychologytoday.com provides a search tool to look for therapists in your area who accept your insurance  https://www.ManchesterLofts.com.cy - Services offered by [facility]  https://www.cbhmh.com  - [facility] is a local resource I like to utilize that seem to be able to fit patient's  in fairly quickly.  https://martin-page.info/  - [facility] for [facility]; Accepts Inusrance  NetCamper.cz - [employer]  https://[facility].org/services/individual-couples-family-counseling/ - Located in [place] and will see Men as well  https://www.childandfamilycounseling.com/ - [facility] located in [place]  https://[facility].com/psychiatric-services/ - Located in [place]  - Often times, it is difficult to obtain counseling services, and it is important to call multiple places all at  once to see who can see you the soonest.    - Check out the Talkspace app for options for text-based and video-based interactions with a therapist: http://www.yang.com/. Some insurances cover this service. BetterHelp is a similar service    Idiopathic constipation  Recommend trial IBGard. This medication is available over-the-counter. Its active ingredient is concentrated peppermint oil. Would recommend taking with antacid such as TUMS, PPI such as protonix or H2 blocker such as pepcid because most common reported side effect is heartburn.  StatisticsWire.com.au

## 2021-07-04 LAB — LIPID PANEL
Cholesterol / HDL Ratio: 3.3 ratio (ref 0.0–4.4)
Cholesterol: 215 mg/dL — ABNORMAL HIGH (ref 100–199)
HDL: 66 mg/dL (ref >39)
LDL Chol Calculated (NIH): 133 mg/dL — ABNORMAL HIGH (ref 0–99)
Triglycerides: 88 mg/dL (ref 0–149)
VLDL Calculated: 16 mg/dL (ref 5–40)

## 2021-07-04 LAB — HEPATITIS C ANTIBODY: HCV AB: <0.1 s/co ratio (ref 0.0–0.9)

## 2021-07-04 LAB — HEMOGLOBIN A1C: Hemoglobin A1C: 5.8 % — ABNORMAL HIGH (ref 4.8–5.6)

## 2021-07-10 NOTE — Progress Notes (Signed)
I reviewed Aubrey Massmann, DO's care of the patient during/immediately following the patient's visit.    Ernestine Rohman A Christionna Poland, MD

## 2021-08-03 ENCOUNTER — Other Ambulatory Visit: Payer: Self-pay | Admitting: Family Medicine

## 2022-01-21 ENCOUNTER — Ambulatory Visit (INDEPENDENT_AMBULATORY_CARE_PROVIDER_SITE_OTHER): Payer: Medicare Other | Admitting: Family Medicine

## 2022-01-21 ENCOUNTER — Encounter (INDEPENDENT_AMBULATORY_CARE_PROVIDER_SITE_OTHER): Payer: Self-pay | Admitting: Family Medicine

## 2022-01-21 VITALS — BP 126/74 | HR 71 | Temp 96.8°F | Resp 18 | Wt 184.0 lb

## 2022-01-21 DIAGNOSIS — R1031 Right lower quadrant pain: Secondary | ICD-10-CM

## 2022-01-21 DIAGNOSIS — E7800 Pure hypercholesterolemia, unspecified: Secondary | ICD-10-CM

## 2022-01-21 DIAGNOSIS — M25531 Pain in right wrist: Secondary | ICD-10-CM

## 2022-01-21 DIAGNOSIS — Z1211 Encounter for screening for malignant neoplasm of colon: Secondary | ICD-10-CM

## 2022-01-21 DIAGNOSIS — I10 Essential (primary) hypertension: Secondary | ICD-10-CM

## 2022-01-21 DIAGNOSIS — M25532 Pain in left wrist: Secondary | ICD-10-CM

## 2022-01-21 DIAGNOSIS — K581 Irritable bowel syndrome with constipation: Secondary | ICD-10-CM

## 2022-01-21 DIAGNOSIS — G8929 Other chronic pain: Secondary | ICD-10-CM

## 2022-01-21 MED ORDER — PRAVASTATIN SODIUM 40 MG PO TABS
40.0000 mg | ORAL_TABLET | Freq: Every day | ORAL | 3 refills | Status: DC
Start: 2022-01-21 — End: 2022-05-08

## 2022-01-21 MED ORDER — HYDROCHLOROTHIAZIDE 25 MG PO TABS
25.0000 mg | ORAL_TABLET | Freq: Every day | ORAL | 3 refills | Status: DC
Start: 2022-01-21 — End: 2022-05-08

## 2022-01-21 NOTE — Patient Instructions (Addendum)
Hello Ms. Megan White,    We discussed trying IBGuard for your constipation and back pain. Here is a link to it on Amazon:     LinxGolf.dk    We also discussed Voltaren gel for your wrist pain, which you can find over the counter at your local pharmacy.     Take care,  Dr. Gilford Rile

## 2022-01-21 NOTE — Progress Notes (Signed)
Patient seen and discussed with Aubrey Massmann, DO.  Physically present for their exam, pertinent portions directly duplicated and agree with their findings.  Chart reviewed to include allergies, problems, current medications and family, social and past medical and surgical histories.  I reviewed the history, and review of systems  as outlined in Aubrey Massmann, DO's note and I agree with the findings as outlined in their note.  We discussed the assessment and plan for the encounter in detail and I concur with the assessment and plan as per Aubrey Massmann, DO .    Shellby Schlink, MD

## 2022-01-21 NOTE — Progress Notes (Signed)
[facility]    [address]  [place], [place] [address]    [phone]                       Date of Exam: 01/21/2022 9:18 AM        Patient ID: Megan White is a 66 y.o. female.  Attending Physician: Merton Border, DO        Reason for visit:    Patient is here today to follow up on issues, including:   Chief Complaint   Patient presents with    Hypertension    Hyperlipidemia    Back Pain            Assessment & Plan:    1. Right lower quadrant abdominal pain  2. Irritable bowel syndrome with constipation  - CBC and differential; Future  - Comprehensive metabolic panel; Future  - Ambulatory referral to Gastroenterology  -Discussed that abdominal pain is likely related to IBS-C as it is relieved by bowel movements and associated with constipation and bloating.  -Recommended daily use of MiraLAX until bowel movements normalize as well as initiation of Ibgard  -We will check basic laboratory evaluation to ensure no other organ dysfunction or evidence infection or inflammation  -Patient is due for screening colonoscopy, so I encouraged that she pursue this both for screening for colon cancer but also to evaluate additional etiologies of this pain    3. Screening for colon cancer  - Ambulatory referral to Gastroenterology    4. Chronic pain of both wrists  - Discussed use of Voltaren for wrist pain     5. Benign essential hypertension  - hydroCHLOROthiazide (HYDRODIURIL) 25 MG tablet; Take 1 tablet (25 mg) by mouth daily  Dispense: 90 tablet; Refill: 3    6. Hypercholesterolemia  - pravastatin (PRAVACHOL) 40 MG tablet; Take 1 tablet (40 mg) by mouth daily  Dispense: 90 tablet; Refill: 3            Follow-up:    Return in about 4 weeks (around 02/18/2022) for Abdominal pain follow-up.             HPI/ROS:    Megan White presents today for: chronic care    #Hypertension  - Medication regimen: HCTZ 25mg  daily    - Home BP: wnl    - BP today: 126/74  - ROS: denies dizziness,  lightheadedness, vision changes, headache, chest pain, SOB, DOE    #Mixed hyperlipidemia  - Medication regimen: Pravastatin 40mg  daily    - Lipid panel: 07/03/21   - No adverse medication reactions      #Back pain   - Right-sided, same pain as before but worsening over time   - Associated with constipation, gas/bloating, improves with bowel movements but quickly recurs   - Due for colonoscopy for colon cancer screening  - No clear specific food triggers; felt 'lighter' after cutting out bread for a few weeks, improved when she ate low carb  - Uses Miralax PRN, taking probiotics which help symptoms     #Bilateral wrist pain   - Multiarticular joint pain, mostly in the wrists   - Worse with use  - Improves with Tylenol PM at night     ROS  Please see HPI for pertinent ROS including timing and descriptors.        Vitals/PE:    BP 126/74   Pulse 71   Temp (!) 96.8 F (36  C)   Resp 18   Wt 83.5 kg (184 lb)   BMI 27.98 kg/m      Physical Exam  General: NAD. Nontoxic appearance.  Pulmonary: Respiratory effort normal. Normal RR. CTAB.  Cardio: RRR. No m/r/g.  Abdomen: Soft with minimal distention.  No tenderness to palpation.  No rebound or guarding.  Bowel sounds normal.  Negative Murphy's and McBurney's signs.  Neuro: Normal speech. Orientation is appropriate. No gross neurologic deficits.    The 10-year ASCVD risk score (Arnett DK, et al., 2019) is: 8.7%    Values used to calculate the score:      Age: 89 years      Sex: Female      Is Non-Hispanic African American: No      Diabetic: No      Tobacco smoker: No      Systolic Blood Pressure: 126 mmHg      Is BP treated: Yes      HDL Cholesterol: 66 mg/dL      Total Cholesterol: 215 mg/dL    We reviewed previous labs and those ordered in anticipation of this visit today during the visit.       Lab Results   Component Value Date    CHOL 215 (H) 07/03/2021    TRIG 88 07/03/2021    HDL 66 07/03/2021    LDL 133 (H) 07/03/2021    UJWJ1B 5.8 (H) 07/03/2021                Problem List:    Patient Active Problem List   Diagnosis    Vitamin D deficiency    Obstructive sleep apnea hypopnea, moderate    Major depressive disorder    Kidney stone    Hypercholesterolemia    Herniated cervical disc    Primary insomnia    Chronic constipation    Benign essential hypertension    Allergic rhinitis due to pollen    Acid reflux             Current Meds:    Outpatient Medications Marked as Taking for the 01/21/22 encounter (Office Visit) with Merton Border, DO   Medication Sig Dispense Refill    diphenhydrAMINE-acetaminophen (TYLENOL PM) 25-500 MG Tab Take 1 tablet by mouth nightly as needed      melatonin 1 mg tablet Take 2 mg by mouth      polyethylene glycol (MIRALAX) 17 GM/SCOOP powder Take by mouth      [DISCONTINUED] hydroCHLOROthiazide (HYDRODIURIL) 25 MG tablet Take 1 tablet (25 mg total) by mouth daily 90 tablet 3    [DISCONTINUED] pravastatin (PRAVACHOL) 40 MG tablet             Allergies:    Allergies   Allergen Reactions    Oxycodone Dizziness    Ace Inhibitors Cough and Other (See Comments)     cough  cough             Past Surgical History:    History reviewed. No pertinent surgical history.        Family History:    History reviewed. No pertinent family history.        Social History:    Social History     Tobacco Use    Smoking status: Never    Smokeless tobacco: Never   Vaping Use    Vaping status: Never Used   Substance Use Topics    Alcohol use: Yes     Alcohol/week: 2.0 - 3.0 standard drinks  of alcohol     Types: 2 - 3 Standard drinks or equivalent per week    Drug use: Never           The following sections were reviewed this encounter by the provider:   Tobacco  Allergies  Meds  Problems  Med Hx  Surg Hx  Fam Hx              Signed by:   Merton BorderAubrey Jameelah Watts, DO  Family Medicine PGY-2

## 2022-03-11 ENCOUNTER — Encounter (INDEPENDENT_AMBULATORY_CARE_PROVIDER_SITE_OTHER): Payer: Self-pay | Admitting: Family Medicine

## 2022-03-11 ENCOUNTER — Ambulatory Visit (INDEPENDENT_AMBULATORY_CARE_PROVIDER_SITE_OTHER): Payer: Medicare Other | Admitting: Family Medicine

## 2022-03-11 VITALS — BP 132/75 | HR 77 | Temp 97.7°F | Resp 18 | Ht 68.0 in | Wt 189.6 lb

## 2022-03-11 DIAGNOSIS — K581 Irritable bowel syndrome with constipation: Secondary | ICD-10-CM

## 2022-03-11 DIAGNOSIS — E7800 Pure hypercholesterolemia, unspecified: Secondary | ICD-10-CM

## 2022-03-11 DIAGNOSIS — Z1212 Encounter for screening for malignant neoplasm of rectum: Secondary | ICD-10-CM

## 2022-03-11 DIAGNOSIS — R7303 Prediabetes: Secondary | ICD-10-CM

## 2022-03-11 DIAGNOSIS — Z0000 Encounter for general adult medical examination without abnormal findings: Secondary | ICD-10-CM

## 2022-03-11 DIAGNOSIS — M5441 Lumbago with sciatica, right side: Secondary | ICD-10-CM

## 2022-03-11 DIAGNOSIS — I10 Essential (primary) hypertension: Secondary | ICD-10-CM

## 2022-03-11 DIAGNOSIS — Z1211 Encounter for screening for malignant neoplasm of colon: Secondary | ICD-10-CM

## 2022-03-11 MED ORDER — TIZANIDINE HCL 4 MG PO TABS
4.0000 mg | ORAL_TABLET | Freq: Four times a day (QID) | ORAL | 5 refills | Status: DC | PRN
Start: 2022-03-11 — End: 2022-12-10

## 2022-03-11 NOTE — Patient Instructions (Signed)
-   Recommend high-fiber diet with educational handouts provided this visit.  Also recommend regular exercise and increased water intake.   - Recommend Benefiber or metamucil daily to maintain soft stools.   - Recommend Magnesium citrate 250-500 mg daily

## 2022-03-11 NOTE — Progress Notes (Unsigned)
Subjective:      Date: 03/11/2022 7:46 AM   Patient ID: Megan White is a 66 y.o. female.    Chief Complaint:  Chief Complaint   Patient presents with    Annual Exam     Not fasting       HPI  Visit Type: Health Maintenance Visit  Work Status: working full-time  Reported Health: excellent health  Reported Diet: moderate compliance with recommended diet and well-balanced diet  Reported Exercise: none  Dental: regular dental visits twice a year  Vision: glasses and regular eye exams   Hearing: normal hearing  Immunization Status: immunizations up to date  Reproductive Health: sexually active and History of abnormal pap:  No, history of cervical polyp  Prior Screening Tests: no previous colorectal cancer screening and pap smear not appropriate at this time  General Health Risks: family history of colon cancer and family history of breast cancer  Safety Elements Used: uses seat belts, smoke detectors in household, and does not text and drive    Patient has hypertension for which she is on HCTZ 25 mg daily.  Patient has obstructive sleep apnea.  Blood pressure today is 132/75.  Patient does not check ambulatory blood pressures.    Patient has hyperlipidemia for which she is on pravastatin 40 mg daily and tolerating well without any noted side effects or myalgias.  Lab Results   Component Value Date    CHOL 215 (H) 07/03/2021     Lab Results   Component Value Date    HDL 66 07/03/2021     Lab Results   Component Value Date    LDL 133 (H) 07/03/2021     Lab Results   Component Value Date    TRIG 88 07/03/2021       Patient is a prediabetic but has never needed to be on medication for diabetes.  Lab Results   Component Value Date    HGBA1C 5.8 (H) 07/03/2021       Patient has IBS with constipation for which she is on MiraLAX as needed and probiotic.  She notes this regimen allows her to have a bowel movement daily but notes that if she does not have a bowel movement daily and her constipation symptoms are  significant.    Patient complains of chronic lower back pain status post back surgery.  She wonders whether she needs to reestablish with her neurosurgeon.  She notes sciatica symptoms on the right side.  It is worse after increased activity.  She has used muscle relaxers in the past which helped.  She wonders what else she can do for her symptoms.    Problem List:  Patient Active Problem List   Diagnosis    Vitamin D deficiency    Obstructive sleep apnea hypopnea, moderate    Major depressive disorder    Kidney stone    Hypercholesterolemia    Herniated cervical disc    Primary insomnia    Chronic constipation    Benign essential hypertension    Allergic rhinitis due to pollen    Acid reflux       Current Medications:  Outpatient Medications Marked as Taking for the 03/11/22 encounter (Office Visit) with Earnestine Leys, MD   Medication Sig Dispense Refill    diphenhydrAMINE-acetaminophen (TYLENOL PM) 25-500 MG Tab Take 1 tablet by mouth nightly as needed      hydroCHLOROthiazide (HYDRODIURIL) 25 MG tablet Take 1 tablet (25 mg) by mouth daily 90 tablet 3  melatonin 1 mg tablet Take 2 tablets (2 mg) by mouth      polyethylene glycol (MIRALAX) 17 GM/SCOOP powder Take by mouth      pravastatin (PRAVACHOL) 40 MG tablet Take 1 tablet (40 mg) by mouth daily 90 tablet 3       Allergies:  Allergies   Allergen Reactions    Oxycodone Dizziness    Ace Inhibitors Cough and Other (See Comments)     cough  cough         Past Medical History:  Past Medical History:   Diagnosis Date    Hypertension        Past Surgical History:  Past Surgical History:   Procedure Laterality Date    REDUCTION MAMMAPLASTY  2001       Family History:  Family History   Problem Relation Age of Onset    Hypertension Mother     Diabetes Mother     Hypertension Father     Hypertension Sister     Diabetes Sister     Cancer Maternal Grandmother         breast and colon       Social History:  Social History     Tobacco Use    Smoking status: Never     Smokeless tobacco: Never   Vaping Use    Vaping status: Never Used   Substance Use Topics    Alcohol use: Yes     Alcohol/week: 2.0 - 3.0 standard drinks of alcohol     Types: 2 - 3 Standard drinks or equivalent per week    Drug use: Never          The following sections were reviewed this encounter by the provider:   Tobacco  Allergies  Meds  Problems  Med Hx  Surg Hx  Fam Hx         ROS:   General/Constitutional:   Denies Change in appetite. Denies Chills. Denies Fatigue. Denies Fever.   Ophthalmologic:   Denies Blurred vision. Denies Eye Discharge. Denies Eye Pain.   ENT:   Denies Nasal Discharge. Denies Hoarseness. Denies Ear pain. Denies Nosebleed. Denies Sinus pain. Denies Sore throat.   Endocrine:   Denies Decreased Libido. Denies Polydipsia. Denies Polyuria. Denies Weakness.   Respiratory:   Denies Paroxysmal Nocturnal Dyspnea. Denies Cough. Denies Orthopnea. Denies Shortness of breath. Denies Daytime Hypersomnolence. Denies Snoring. Denies Witness Apnea. Denies Wheezing.   Cardiovascular:   Denies Chest pain. Denies Chest pain with exertion. Denies Leg Claudication. Denies Palpitations. Denies Swelling in hands/feet.   Gastrointestinal:   Denies Abdominal pain. Denies Blood in stool.  Reports constipation. Denies Diarrhea. Denies Heartburn. Denies Nausea. Denies Vomiting.   Hematology:   Denies Easy bruising. Denies Easy Bleeding. Denies Swollen glands.   Genitourinary:   Denies Blood in urine. Denies Nocturia. Denies Painful urination.   Musculoskeletal:   Reports joint pain. Denies Joint stiffness. Denies Leg cramps. Denies Muscle aches. Denies Weakness in LE. Denies Swollen joints. Denies Weakness in UE.   Skin:   Denies Itching. Denies Change in Mole(s). Denies Rash.   Neurologic:   Denies Balance difficulty. Denies Dizziness. Denies Gait abnormality. Denies Headache. Denies Pre-Syncope. Denies Memory loss. Denies Seizures.  Reports tingling/Numbness.   Psychiatric:   Denies Anxiety. Denies  Depressed mood. Denies Difficulty sleeping.     Objective:     Vitals:  BP 132/75 (BP Site: Left arm, Patient Position: Sitting, Cuff Size: Medium)   Pulse 77   Temp 97.7  F (36.5 C) (Temporal)   Resp 18   Ht 1.727 m (5\' 8" )   Wt 86 kg (189 lb 9.6 oz)   BMI 28.83 kg/m     Examination:   General Examination:   GENERAL APPEARANCE: alert, in no acute distress, well developed, well nourished, oriented to time, place, and person.   HEAD: normal appearance, atraumatic.   EYES: extraocular movement intact (EOMI), pupils equal, round, reactive to light and accommodation, sclera anicteric, conjunctiva clear.   EARS: tympanic membranes normal bilaterally, external canals normal .   NOSE: normal nasal mucosa, no lesions.   ORAL CAVITY: normal oropharynx, normal lips, mucosa moist, no lesions.   THROAT: normal appearance, clear, no erythema.   NECK/THYROID: neck supple, no carotid bruit, carotid pulse 2+ bilaterally, no cervical lymphadenopathy, no neck mass palpated, no jugular venous distention, no thyromegaly.   LYMPH NODES: no palpable adenopathy.   SKIN: good turgor, no rashes, no suspicious lesions.   HEART: S1, S2 normal, no murmurs, rubs, gallops, regular rate and rhythm.   LUNGS: normal effort / no distress, normal breath sounds, clear to auscultation bilaterally, no wheezes, rales, rhonchi.   ABDOMEN: bowel sounds present, no hepatosplenomegaly, soft, nontender, nondistended.   MUSCULOSKELETAL: full range of motion, no swelling or deformity.   EXTREMITIES: no edema, no clubbing, cyanosis, or edema.   PERIPHERAL PULSES: 2+ dorsalis pedis, 2+ posterior tibial.   NEUROLOGIC: nonfocal, cranial nerves 2-12 grossly intact, normal strength, tone and sensory exam intact.   PSYCH: cognitive function intact, mood/affect full range, speech clear.     Assessment/Plan:       1. Annual physical exam  - Lipid panel; Future  - Comprehensive metabolic panel; Future  - CBC and differential; Future  - Hemoglobin A1C;  Future      Health Maintenance:  Recommend drinking at least 60-80 ounces of water per day. Recommend optimizing low sodium diet measures ( less than 2 grams of sodium in the diet per day ).Immunizations UTD. Vision screening UTD. Dental Screening UTD. Mammogram screening is UTD. Colonoscopy is due.  GI consult for colonoscopy ordered.    2. Screening for colorectal cancer  - Referral to Gastroenterology ([facility]); Future    3. Benign essential hypertension  -Stable  -Current blood pressure is at goal.   - Continue to optimize low salt diet and aerobic exercise efforts. and Continue current medication regimen.   -Recommend optimizing therapeutic lifestyle changes which include obtaining at least 150 minutes of aerobic exercise per week and eating a heart healthy diet ( i.e. DASH diet - www.heart.org or PopSteam.is ). Recommend out-of-office blood pressure measurements for titration of BP-lowering medication. Recommend checking ambulatory blood pressures once or twice a day and document in a log.  Bring the log and blood pressure cuff into the office at the next follow-up visit or utilize MyChart to communicate BP readings every 2-4 weeks.  - Comprehensive metabolic panel; Future    4. Hypercholesterolemia  -Chronic  -Tolerating pravastatin well so we will continue patient on same.  Patient due for lipid profile.  -To help improve your cholesterol recommend aerobic exercising such as power walking, jogging, elliptical or exercise bike/cycling.  To improve your cholesterol with diet changes recommend avoiding cooking with oils containing complex fats such as coconut oil, margarine and butter.  Olive oil is to healthy as if you are going to cook with oils.  Recommend avoiding marbled meats such as steak and ribs which have stranding of fats through them.  Chicken without  the skin [place] without the skin and fish orderliness.  Recommend choosing lean ground meats.  Avoid fast food and fried foods.  - Lipid panel;  Future    5. Prediabetes  -Chronic  -Recommend continuing to work on [employer] and regular exercise to prevent progression to diabetes.  - Hemoglobin A1C; Future    6. Irritable bowel syndrome with constipation  - Chronic  - Recommend high-fiber diet with educational handouts provided this visit.  Also recommend regular exercise and increased water intake.   - Recommend Benefiber or metamucil daily to maintain soft stools.   - Recommend Magnesium citrate 250-500 mg daily     7. Acute right-sided low back pain with right-sided sciatica  -Chronic  -Recommend muscle relaxers after exercise.  NSAIDs as needed for pain but can take prophylactically after exercise.  Consider physical therapy.  May decide to establish with neurosurgery.  - tiZANidine (ZANAFLEX) 4 MG tablet; Take 1 tablet (4 mg) by mouth every 6 (six) hours as needed (back spasm)  Dispense: 30 tablet; Refill: 5      Return in about 6 months (around 09/11/2022) for Chronic disease management.    Earnestine Leys, MD

## 2022-03-11 NOTE — Progress Notes (Unsigned)
Have you seen any specialists since your last visit with [place]?  No      The patient was informed that the following HM items are still outstanding:   colonoscopy, shingles vaccine, and

## 2022-03-12 ENCOUNTER — Other Ambulatory Visit (INDEPENDENT_AMBULATORY_CARE_PROVIDER_SITE_OTHER): Payer: Self-pay | Admitting: Family Medicine

## 2022-03-12 ENCOUNTER — Encounter (INDEPENDENT_AMBULATORY_CARE_PROVIDER_SITE_OTHER): Payer: Self-pay

## 2022-03-13 ENCOUNTER — Telehealth (INDEPENDENT_AMBULATORY_CARE_PROVIDER_SITE_OTHER): Payer: Self-pay

## 2022-03-13 LAB — AMBIG ABBREV CBC/DIFF DEFAULT
Baso(Absolute): 0.1 10*3/uL (ref 0.0–0.2)
Basophils Automated: 1 %
Eosinophils Absolute: 0.1 10*3/uL (ref 0.0–0.4)
Eosinophils Automated: 1 %
Hematocrit: 36.1 % (ref 34.0–46.6)
Hemoglobin: 11.8 g/dL (ref 11.1–15.9)
Immature Granulocytes Absolute: 0 10*3/uL (ref 0.0–0.1)
Immature Granulocytes: 0 %
Lymphocytes Absolute: 3.6 10*3/uL — ABNORMAL HIGH (ref 0.7–3.1)
Lymphocytes Automated: 59 %
MCH: 27.6 pg (ref 26.6–33.0)
MCHC: 32.7 g/dL (ref 31.5–35.7)
MCV: 85 fL (ref 79–97)
Monocytes Absolute: 0.6 10*3/uL (ref 0.1–0.9)
Monocytes: 10 %
Neutrophils Absolute Count: 1.8 10*3/uL (ref 1.4–7.0)
Neutrophils: 29 %
Platelets: 253 10*3/uL (ref 150–450)
RBC: 4.27 x10E6/uL (ref 3.77–5.28)
RDW: 11.8 % (ref 11.7–15.4)
WBC: 6.2 10*3/uL (ref 3.4–10.8)

## 2022-03-13 LAB — LIPID PANEL, WITHOUT TOTAL CHOLESTEROL/HDL RATIO, SERUM
Cholesterol: 197 mg/dL (ref 100–199)
HDL: 64 mg/dL (ref >39)
LDL Chol Calculated (NIH): 120 mg/dL — ABNORMAL HIGH (ref 0–99)
Triglycerides: 70 mg/dL (ref 0–149)
VLDL Calculated: 13 mg/dL (ref 5–40)

## 2022-03-13 LAB — COMPREHENSIVE METABOLIC PANEL
ALT: 21 IU/L (ref 0–32)
AST (SGOT): 25 IU/L (ref 0–40)
Albumin/Globulin Ratio: 1.5 (ref 1.2–2.2)
Albumin: 4.3 g/dL (ref 3.8–4.8)
Alkaline Phosphatase: 40 IU/L — ABNORMAL LOW (ref 44–121)
BUN / Creatinine Ratio: 16 (ref 12–28)
BUN: 16 mg/dL (ref 8–27)
Bilirubin, Total: 0.2 mg/dL (ref 0.0–1.2)
CO2: 28 mmol/L (ref 20–29)
Calcium: 9.8 mg/dL (ref 8.7–10.3)
Chloride: 98 mmol/L (ref 96–106)
Creatinine: 1.02 mg/dL — ABNORMAL HIGH (ref 0.57–1.00)
Globulin, Total: 2.9 g/dL (ref 1.5–4.5)
Glucose: 87 mg/dL (ref 70–99)
Potassium: 4.4 mmol/L (ref 3.5–5.2)
Protein, Total: 7.2 g/dL (ref 6.0–8.5)
Sodium: 138 mmol/L (ref 134–144)
eGFR: 60 mL/min/{1.73_m2} (ref >59)

## 2022-03-13 LAB — SPECIMEN STATUS REPORT

## 2022-03-13 LAB — HEMOGLOBIN A1C: Hemoglobin A1C: 5.6 % (ref 4.8–5.6)

## 2022-03-13 NOTE — Telephone Encounter (Signed)
Patient called to schedule OV.

## 2022-03-13 NOTE — Progress Notes (Signed)
CBC (complete blood count) shows that you are not anemic and your white blood cell count and platelet count are within acceptable limits.  Comprehensive Metabolic Panel (CMP) which includes liver function, kidney function, electrolytes, and glucose is within acceptable limits.   A1C (3 month avg sugar screening for diabetes) is within the normal range.   Lipid panel (total cholesterol, triglycerides, and HDL (good) cholesterol) are within acceptable limits.   Bad (LDL) cholesterol is higher than the normal range on your current dose of pravastatin.  Recommend continuing same dose for now and work on low-fat diet and exercise before we consider adjusting medication since your numbers are improving.  Recommend working on healthy low fat diet and exercise to help improve your cholesterol and lower your risk of cardiovascular disease (strokes and heart attack).  Recommend omega-3 fatty acid supplements, a diet that is high in nuts, soy based products, lean meats (such as poultry and fish) and green teas.  Avoid cooking with oils (olive oil is the healthiest of the oils), margarine or butter.  Avoid fast food or fried foods.    Follow up appointment in 6 months or sooner for any new concerns

## 2022-03-13 NOTE — Telephone Encounter (Signed)
[facility] Checklist    INDICATION:  Colon Cancer Screening, Personal History of Colon Polyps (Obtain Procedure/Pathology Reports), and Family History of Colon Cancer/Colon Polyps  Grand mother diagnosed with colon cancer.     Gastrointestinal Symptoms:  Colonoscopy w/in last 10 year. Date: 2018 and Chronic Constipation  IBS      Medical History: None  Moderate OSA  1 htn medication  ACTION TAKEN:  Office visit scheduled      Please schedule patient for clinic visit at the [facility] location.    Thanks!    Norwood Levo

## 2022-04-04 ENCOUNTER — Telehealth (INDEPENDENT_AMBULATORY_CARE_PROVIDER_SITE_OTHER): Payer: Self-pay | Admitting: Family Medicine

## 2022-04-04 NOTE — Telephone Encounter (Signed)
Per pt she was unable to get an appt for colorectal screening, pt would like advice at [phone].

## 2022-04-05 ENCOUNTER — Ambulatory Visit (INDEPENDENT_AMBULATORY_CARE_PROVIDER_SITE_OTHER): Payer: Medicare Other | Admitting: Family Medicine

## 2022-04-09 ENCOUNTER — Encounter (INDEPENDENT_AMBULATORY_CARE_PROVIDER_SITE_OTHER): Payer: Self-pay

## 2022-05-01 ENCOUNTER — Ambulatory Visit (INDEPENDENT_AMBULATORY_CARE_PROVIDER_SITE_OTHER): Payer: Medicare Other | Admitting: Family Medicine

## 2022-05-01 ENCOUNTER — Encounter (INDEPENDENT_AMBULATORY_CARE_PROVIDER_SITE_OTHER): Payer: Self-pay | Admitting: Family Medicine

## 2022-05-01 VITALS — BP 136/81 | HR 68 | Temp 98.1°F | Ht 68.0 in | Wt 189.6 lb

## 2022-05-01 DIAGNOSIS — E663 Overweight: Secondary | ICD-10-CM

## 2022-05-01 DIAGNOSIS — I10 Essential (primary) hypertension: Secondary | ICD-10-CM

## 2022-05-01 DIAGNOSIS — Z6828 Body mass index (BMI) 28.0-28.9, adult: Secondary | ICD-10-CM

## 2022-05-01 DIAGNOSIS — E7800 Pure hypercholesterolemia, unspecified: Secondary | ICD-10-CM

## 2022-05-01 DIAGNOSIS — R7303 Prediabetes: Secondary | ICD-10-CM

## 2022-05-01 NOTE — Progress Notes (Deleted)
Subjective:      Date: 05/01/2022 8:26 AM   Patient ID: Megan White is a 66 y.o. female.    Chief Complaint:  No chief complaint on file.      HPI:  Visit Type: Pre-operative Evaluation  Procedure: Colonoscopy  Date of Surgery: {DATES:25215}  Surgeon: ***  Fax Number (Required): ***  Chief Complaint: ***  Recent Health (admits): {NMG Pre-op Recent Health:35380}  Recent Health (denies): {NMG Pre-op Recent Health:35380}  Exercise Tolerance: {NMG Pre-op Exercise Tolerance:35381}  Surgical Risk Factors: hypertension and sleep apnea  Prior Anesthesia: {NMG anesthesia:40789}    Patient has hypertension for which she is on HCTZ 25 mg daily and tries to stay consistent with a low-salt diet and remains physically active for exercise.  Patient has obstructive sleep apnea and is not on CPAP as she is unable to tolerate same.  BP Readings from Last 3 Encounters:   03/11/22 132/75   01/21/22 126/74   06/29/21 116/74         Problem List:  Patient Active Problem List   Diagnosis    Vitamin D deficiency    Obstructive sleep apnea hypopnea, moderate    Major depressive disorder    Kidney stone    Hypercholesterolemia    Herniated cervical disc    Primary insomnia    Chronic constipation    Benign essential hypertension    Allergic rhinitis due to pollen    Acid reflux       Current Medications:  Current Outpatient Medications   Medication Sig Dispense Refill    diphenhydrAMINE-acetaminophen (TYLENOL PM) 25-500 MG Tab Take 1 tablet by mouth nightly as needed      hydroCHLOROthiazide (HYDRODIURIL) 25 MG tablet Take 1 tablet (25 mg) by mouth daily 90 tablet 3    melatonin 1 mg tablet Take 2 tablets (2 mg) by mouth      polyethylene glycol (MIRALAX) 17 GM/SCOOP powder Take by mouth      pravastatin (PRAVACHOL) 40 MG tablet Take 1 tablet (40 mg) by mouth daily 90 tablet 3    tiZANidine (ZANAFLEX) 4 MG tablet Take 1 tablet (4 mg) by mouth every 6 (six) hours as needed (back spasm) 30 tablet 5     No current facility-administered medications  for this visit.       Allergies:  Allergies   Allergen Reactions    Oxycodone Dizziness    Ace Inhibitors Cough and Other (See Comments)     cough  cough         Past Medical History:  Past Medical History:   Diagnosis Date    Hypertension        Past Surgical History:  Past Surgical History:   Procedure Laterality Date    REDUCTION MAMMAPLASTY  2001       Family History:  Family History   Problem Relation Age of Onset    Hypertension Mother     Diabetes Mother     Hypertension Father     Hypertension Sister     Diabetes Sister     Cancer Maternal Grandmother         breast and colon       Social History:  Social History     Socioeconomic History    Marital status: Married   Tobacco Use    Smoking status: Never    Smokeless tobacco: Never   Vaping Use    Vaping Use: Never used   Substance and Sexual Activity    Alcohol  use: Yes     Alcohol/week: 2.0 - 3.0 standard drinks of alcohol     Types: 2 - 3 Standard drinks or equivalent per week    Drug use: Never    Sexual activity: Yes     Partners: Male     Social Determinants of Health     Financial Resource Strain: Low Risk  (02/25/2022)    Overall Financial Resource Strain (CARDIA)     Difficulty of Paying Living Expenses: Not hard at all   Food Insecurity: Unknown (02/25/2022)    Hunger Vital Sign     Worried About Running Out of Food in the Last Year: Patient refused     Ran Out of Food in the Last Year: Patient refused   Transportation Needs: No Transportation Needs (02/25/2022)    PRAPARE - Therapist, art (Medical): No     Lack of Transportation (Non-Medical): No   Physical Activity: Unknown (02/25/2022)    Exercise Vital Sign     Days of Exercise per Week: 3 days   Stress: Stress Concern Present (02/25/2022)    [employer] of Occupational Health - Occupational Stress Questionnaire     Feeling of Stress : Very much   Social Connections: Unknown (02/25/2022)    Social Connection and Isolation Panel [NHANES]     Frequency of Communication with  Friends and Family: Patient refused     Frequency of Social Gatherings with Friends and Family: More than three times a week     Attends Religious Services: Patient refused     Active Member of Clubs or Organizations: Yes     Attends Banker Meetings: Patient refused     Marital Status: Married   Catering manager Violence: Not At Risk (02/25/2022)    Humiliation, Afraid, Rape, and Kick questionnaire     Fear of Current or Ex-Partner: No     Emotionally Abused: No     Physically Abused: No     Sexually Abused: No   Housing Stability: Low Risk  (02/25/2022)    Housing Stability Vital Sign     Unable to Pay for Housing in the Last Year: No     Number of Places Lived in the Last Year: 2     Unstable Housing in the Last Year: No         ROS:  General/Constitutional:           Denies Chills.  Denies Fatigue.  Denies Fever.       Ophthalmologic:           Denies Blurred vision.       ENT:           Denies Nasal Discharge.  Denies Sinus pain.  Denies Sore throat.       Endocrine:           Denies Polydipsia.  Denies Polyuria.       Respiratory:           Denies Cough.  Denies Orthopnea.  Denies Shortness of breath.  Denies Wheezing.       Cardiovascular:           Denies Chest pain.  Denies Chest pain with exertion.  Denies Palpitations.  Denies Swelling in hands/feet.       Gastrointestinal:           Denies Abdominal pain.  Denies Constipation.  Denies Diarrhea.  Denies Nausea.  Denies Vomiting.  Hematology:           Denies Easy bruising.  Denies Easy Bleeding.       Genitourinary:           Denies Blood in urine.  Denies Painful urination.       Peripheral Vascular:           Denies Pain/cramping in legs after exertion.  Denies Painful extremities.       Skin:           Denies Rash.       Neurologic:           Denies Dizziness.  Denies Pre-Syncope.  Denies Tingling/Numbness.       Psychiatric:           Denies Anxiety.  Denies Depressed mood.       Objective:     Vitals:  There were no vitals taken for  this visit.    Examination:   General Examination:  GENERAL APPEARANCE: alert, in no acute distress, well developed, well nourished, oriented to time, place, and person.   HEAD: normal appearance.   EYES: extraocular movement intact (EOMI), pupils equal, round, reactive to light and accommodation, sclera anicteric.   EARS: tympanic membranes normal bilaterally, external canals normal .   NOSE: normal nasal mucosa, nares patent.   ORAL CAVITY: normal oropharynx.   THROAT: no erythema, no exudate.   NECK/THYROID: neck supple, carotid pulse 2+ bilaterally, no lymphadenopathy, no thyromegaly.   SKIN: no rashes.   HEART: S1, S2 normal, no murmurs, rubs, gallops, regular rate and rhythm.   LUNGS: normal effort / no distress, normal breath sounds, clear to auscultation bilaterally, no wheezes, rales, rhonchi.   ABDOMEN: bowel sounds present, no hepatosplenomegaly, soft, nontender, nondistended.   EXTREMITIES: no clubbing, cyanosis, or edema.   PERIPHERAL PULSES: 2+ dorsalis pedis, 2+ posterior tibial.   NEUROLOGIC: nonfocal, cranial nerves 2-12 grossly intact, normal strength, tone and reflexes, sensory exam intact.   PSYCH: alert, oriented, cognitive function intact.    Assessment:     1. Pre-op examination    2. Screening for colorectal cancer      Plan:   Pre-Operative Evaluation:  Surgery Specific Risk:  {Surgery Specific Risk - reference:40794}  {NMGPreopplan:35387}      Return in about 6 months (around 11/01/2022) for Medicare Wellness Visit.    Earnestine Leys, MD

## 2022-05-01 NOTE — Progress Notes (Unsigned)
Have you seen any specialists/other providers since your last visit with [place]?    No     Arm preference verified?   Yes    Health Maintenance Due   Topic Date Due    DXA Scan  Never done    Shingrix Vaccine 50+ (1) Never done    COVID-19 Vaccine (6 - Pfizer series) 11/30/2021    Colorectal Cancer Screening  12/31/2021

## 2022-05-01 NOTE — Progress Notes (Signed)
 Subjective:      Date: 05/01/2022 8:43 AM   Patient ID: Megan White is a 66 y.o. female.    Chief Complaint:  Chief Complaint   Patient presents with    Weight Loss       HPI:  HPI  Patient is a 66 year old female who comes into clinic to discuss weight concern.    Patient is concerned about weight gain and her chronic medical conditions increasing her risk of heart disease.  She recently moved to [place] and notes that she is less physically active than before.  She notes that since moving in with her daughter her diet is different as well.  Patient would like to make dietary changes and increase her exercise to better control her weight.  She wonders what other options are out there to help her with weight loss.  She has heard about Ozempic and wonders whether that would be a good option for her especially in the setting of prediabetes to prevent diabetes.  She has gained 5 pounds since over the past 2 months (BMI is currently 28.8 from 27.98.).  Of note patient has prediabetes, hypercholesterolemia and hypertension.          Problem List:  Patient Active Problem List   Diagnosis    Vitamin D deficiency    Obstructive sleep apnea hypopnea, moderate    Major depressive disorder    Kidney stone    Hypercholesterolemia    Herniated cervical disc    Primary insomnia    Chronic constipation    Benign essential hypertension    Allergic rhinitis due to pollen    Acid reflux       Current Medications:  Current Outpatient Medications   Medication Sig Dispense Refill    diphenhydrAMINE-acetaminophen (TYLENOL PM) 25-500 MG Tab Take 1 tablet by mouth nightly as needed      hydroCHLOROthiazide (HYDRODIURIL) 25 MG tablet Take 1 tablet (25 mg) by mouth daily 90 tablet 3    melatonin 1 mg tablet Take 2 tablets (2 mg) by mouth      polyethylene glycol (MIRALAX) 17 GM/SCOOP powder Take by mouth      pravastatin (PRAVACHOL) 40 MG tablet Take 1 tablet (40 mg) by mouth daily 90 tablet 3    tiZANidine (ZANAFLEX) 4 MG tablet Take 1 tablet  (4 mg) by mouth every 6 (six) hours as needed (back spasm) 30 tablet 5     No current facility-administered medications for this visit.       Allergies:  Allergies   Allergen Reactions    Oxycodone Dizziness    Ace Inhibitors Cough and Other (See Comments)     cough  cough         Past Medical History:  Past Medical History:   Diagnosis Date    Hypertension        ROS:  Review of Systems   Constitutional:  Negative for chills and fever.   Respiratory:  Negative for shortness of breath and wheezing.    Cardiovascular:  Negative for chest pain and palpitations.   Gastrointestinal:  Negative for abdominal pain, diarrhea, nausea and vomiting.   Musculoskeletal: Negative.    Neurological:  Negative for dizziness, light-headedness and headaches.   Psychiatric/Behavioral: Negative.          Objective:     Vitals:  BP 136/81 (BP Site: Left arm, Patient Position: Sitting, Cuff Size: Large)   Pulse 68   Temp 98.1 F (36.7 C) (Temporal)   Ht  1.727 m (5\' 8" )   Wt 86 kg (189 lb 9.6 oz)   SpO2 99%   BMI 28.83 kg/m     Physical Exam:  Physical Exam  Constitutional:       Appearance: Normal appearance. She is well-developed and normal weight. She is not ill-appearing.   HENT:      Head: Normocephalic and atraumatic.   Eyes:      Extraocular Movements: Extraocular movements intact.      Conjunctiva/sclera: Conjunctivae normal.      Pupils: Pupils are equal, round, and reactive to light.   Pulmonary:      Effort: Pulmonary effort is normal. No respiratory distress.   Musculoskeletal:      Cervical back: Normal range of motion.   Neurological:      General: No focal deficit present.      Mental Status: She is alert and oriented to person, place, and time.      Cranial Nerves: No cranial nerve deficit.   Psychiatric:         Mood and Affect: Mood normal.         Behavior: Behavior normal.         Thought Content: Thought content normal.         Judgment: Judgment normal.         Assessment/Plan:       1. Overweight with body  mass index (BMI) of 28 to 28.9 in adult  2. Benign essential hypertension  3. Hypercholesterolemia  4. Prediabetes  - Acute  - Patient educated about healthy diet and exercise. Recommend working eat 3 meals daily with 2 heathy snacks in between. Work on portion control. Avoid sugary drink and sodas. Avoid junk food, fast food and fried foods.  Avoid late night snacking by fasting from 7 PM to 7 AM.  Cooking at home is best. Recommend a well balance diet/Mediterranean diet. Recommend regular aerobic exercise: power walking, exercise bike, elliptical or treadmill for at least 30-45 min x 5 days a week.   -Recommend nutritional and exercise counseling.  As patient works on lifestyle modification may be a candidate for medication assisted weight loss.  Patient referred to weight loss center for management of same.  - Ambulatory referral to Weight Loss Services; Future      Return in about 6 months (around 11/01/2022) for Medicare Wellness Visit.    Earnestine Leys, MD

## 2022-05-07 ENCOUNTER — Other Ambulatory Visit (INDEPENDENT_AMBULATORY_CARE_PROVIDER_SITE_OTHER): Payer: Self-pay | Admitting: Family Medicine

## 2022-05-07 DIAGNOSIS — I10 Essential (primary) hypertension: Secondary | ICD-10-CM

## 2022-05-07 DIAGNOSIS — E7800 Pure hypercholesterolemia, unspecified: Secondary | ICD-10-CM

## 2022-06-06 ENCOUNTER — Other Ambulatory Visit: Payer: Self-pay

## 2022-06-06 DIAGNOSIS — K5909 Other constipation: Secondary | ICD-10-CM

## 2022-06-06 DIAGNOSIS — Z800 Family history of malignant neoplasm of digestive organs: Secondary | ICD-10-CM | POA: Insufficient documentation

## 2022-06-06 DIAGNOSIS — Z1211 Encounter for screening for malignant neoplasm of colon: Secondary | ICD-10-CM

## 2022-06-06 DIAGNOSIS — Z8679 Personal history of other diseases of the circulatory system: Secondary | ICD-10-CM | POA: Insufficient documentation

## 2022-06-24 ENCOUNTER — Ambulatory Visit (INDEPENDENT_AMBULATORY_CARE_PROVIDER_SITE_OTHER): Payer: Medicare Other | Admitting: Family Medicine

## 2022-06-24 ENCOUNTER — Encounter (INDEPENDENT_AMBULATORY_CARE_PROVIDER_SITE_OTHER): Payer: Self-pay | Admitting: Family Medicine

## 2022-06-24 VITALS — BP 148/78 | HR 65 | Temp 97.9°F | Ht 68.0 in | Wt 186.8 lb

## 2022-06-24 DIAGNOSIS — Z1211 Encounter for screening for malignant neoplasm of colon: Secondary | ICD-10-CM

## 2022-06-24 DIAGNOSIS — I10 Essential (primary) hypertension: Secondary | ICD-10-CM

## 2022-06-24 DIAGNOSIS — R7303 Prediabetes: Secondary | ICD-10-CM

## 2022-06-24 DIAGNOSIS — E7800 Pure hypercholesterolemia, unspecified: Secondary | ICD-10-CM

## 2022-06-24 DIAGNOSIS — Z1212 Encounter for screening for malignant neoplasm of rectum: Secondary | ICD-10-CM

## 2022-06-24 DIAGNOSIS — Z01818 Encounter for other preprocedural examination: Secondary | ICD-10-CM

## 2022-06-24 LAB — CBC AND DIFFERENTIAL
Absolute NRBC: 0 10*3/uL (ref 0.00–0.00)
Basophils Absolute Automated: 0.04 10*3/uL (ref 0.00–0.08)
Basophils Automated: 0.7 %
Eosinophils Absolute Automated: 0.05 10*3/uL (ref 0.00–0.44)
Eosinophils Automated: 0.9 %
Hematocrit: 39 % (ref 34.7–43.7)
Hgb: 12 g/dL (ref 11.4–14.8)
Immature Granulocytes Absolute: 0.01 10*3/uL (ref 0.00–0.07)
Immature Granulocytes: 0.2 %
Instrument Absolute Neutrophil Count: 1.75 10*3/uL (ref 1.10–6.33)
Lymphocytes Absolute Automated: 3.24 10*3/uL — ABNORMAL HIGH (ref 0.42–3.22)
Lymphocytes Automated: 57.9 %
MCH: 26.9 pg (ref 25.1–33.5)
MCHC: 30.8 g/dL — ABNORMAL LOW (ref 31.5–35.8)
MCV: 87.4 fL (ref 78.0–96.0)
MPV: 9.4 fL (ref 8.9–12.5)
Monocytes Absolute Automated: 0.51 10*3/uL (ref 0.21–0.85)
Monocytes: 9.1 %
Neutrophils Absolute: 1.75 10*3/uL (ref 1.10–6.33)
Neutrophils: 31.2 %
Nucleated RBC: 0 /100 WBC (ref 0.0–0.0)
Platelets: 284 10*3/uL (ref 142–346)
RBC: 4.46 10*6/uL (ref 3.90–5.10)
RDW: 13 % (ref 11–15)
WBC: 5.6 10*3/uL (ref 3.10–9.50)

## 2022-06-24 LAB — ECG 12-LEAD
Atrial Rate: 51 {beats}/min
P Axis: 36 degrees
P-R Interval: 206 ms
Q-T Interval: 442 ms
QRS Duration: 84 ms
QTC Calculation (Bezet): 407 ms
R Axis: 1 degrees
T Axis: 30 degrees
Ventricular Rate: 51 {beats}/min

## 2022-06-24 LAB — COMPREHENSIVE METABOLIC PANEL
ALT: 26 U/L (ref 0–55)
AST (SGOT): 26 U/L (ref 5–41)
Albumin/Globulin Ratio: 1.3 (ref 0.9–2.2)
Albumin: 4.2 g/dL (ref 3.5–5.0)
Alkaline Phosphatase: 37 U/L (ref 37–117)
Anion Gap: 8 (ref 5.0–15.0)
BUN: 9 mg/dL (ref 7.0–21.0)
Bilirubin, Total: 0.3 mg/dL (ref 0.2–1.2)
CO2: 30 mEq/L — ABNORMAL HIGH (ref 17–29)
Calcium: 9.9 mg/dL (ref 8.5–10.5)
Chloride: 102 mEq/L (ref 99–111)
Creatinine: 0.8 mg/dL (ref 0.4–1.0)
Globulin: 3.3 g/dL (ref 2.0–3.6)
Glucose: 93 mg/dL (ref 70–100)
Potassium: 4.3 mEq/L (ref 3.5–5.3)
Protein, Total: 7.5 g/dL (ref 6.0–8.3)
Sodium: 140 mEq/L (ref 135–145)
eGFR: >60 mL/min/{1.73_m2} (ref >=60)

## 2022-06-24 LAB — APTT: PTT: 29 s (ref 27–39)

## 2022-06-24 LAB — PT/INR
PT INR: 0.9 (ref 0.9–1.1)
PT: 10.6 s (ref 10.1–12.9)

## 2022-06-24 LAB — HEMOLYSIS INDEX: Hemolysis Index: 7 Index (ref 0–24)

## 2022-06-24 NOTE — Progress Notes (Signed)
Have you seen any specialists/other providers since your last visit with [place]?    No     Arm preference verified?   Yes    Health Maintenance Due   Topic Date Due    DXA Scan  Never done    Shingrix Vaccine 50+ (1) Never done    COVID-19 Vaccine (6 - Pfizer series) 11/30/2021    Colorectal Cancer Screening  12/31/2021    INFLUENZA VACCINE  05/28/2022

## 2022-06-24 NOTE — Progress Notes (Signed)
Subjective:      Date: 06/24/2022 11:09 AM   Patient ID: Megan White is a 66 y.o. female.    Chief Complaint:  Chief Complaint   Patient presents with    Pre-op Exam     Colonoscopy Procedure scheduled on 07/23/2022 at [facility] , with Annamaria HellingKataria Rahul, MD        HPI:  Visit Type: Pre-operative Evaluation  Procedure: colonoscopy  Date of Surgery: July 23, 2022  Surgeon: Annamaria HellingKataria Rahul, MD   Fax Number (Required): per chart  Chief Complaint: Patient due for screening colonoscopy due to family history patient is due every 5 years for screening colonoscopy. Patient has IBS with constipation for which she is on MiraLAX as needed and probiotic.   Recent Health (admits): no current complaints  Recent Health (denies): fever, fatigue, chest pain, cough, nausea, vomiting, diarrhea, dyspnea, dysuria, urinary frequency, abdominal pain, easy bruising, LE swelling, and poor exercise tolerance  Exercise Tolerance: 5 met ( i.e. dancing )  Surgical Risk Factors: hypertension  Prior Anesthesia: Patient reports no adverse reaction to anesthesia in the past.     Patient has hypertension for which she is on HCTZ 25 mg daily.  Patient has obstructive mild sleep apnea.  Blood pressure today is 135/80 on manual repeat.  Patient does not check ambulatory blood pressures.     BP Readings from Last 3 Encounters:   06/24/22 148/78   05/01/22 136/81   03/11/22 132/75        Patient has hyperlipidemia for which she is on pravastatin 40 mg daily and tolerating well without any noted side effects or myalgias.    Lab Results   Component Value Date    CHOL 197 03/12/2022    CHOL 215 (H) 07/03/2021     Lab Results   Component Value Date    HDL 64 03/12/2022    HDL 66 07/03/2021     Lab Results   Component Value Date    LDL 120 (H) 03/12/2022    LDL 133 (H) 07/03/2021     Lab Results   Component Value Date    TRIG 70 03/12/2022    TRIG 88 07/03/2021     Patient is a prediabetic but has never needed to be on medication for diabetes.  Lab  Results   Component Value Date    HGBA1C 5.6 03/12/2022          Problem List:  Patient Active Problem List   Diagnosis    Vitamin D deficiency    Obstructive sleep apnea hypopnea, moderate    Major depressive disorder    Kidney stone    Hypercholesterolemia    Herniated cervical disc    Primary insomnia    Chronic constipation    Benign essential hypertension    Allergic rhinitis due to pollen    Acid reflux    History of hypertension    Family history of colon cancer       Current Medications:  Current Outpatient Medications   Medication Sig Dispense Refill    diphenhydrAMINE-acetaminophen (TYLENOL PM) 25-500 MG Tab Take 1 tablet by mouth nightly as needed      hydroCHLOROthiazide (HYDRODIURIL) 25 MG tablet  90 tablet 1    melatonin 1 mg tablet Take 2 tablets (2 mg) by mouth      polyethylene glycol (MIRALAX) 17 GM/SCOOP powder Take by mouth      pravastatin (PRAVACHOL) 40 MG tablet  90 tablet 1    tiZANidine (  ZANAFLEX) 4 MG tablet Take 1 tablet (4 mg) by mouth every 6 (six) hours as needed (back spasm) 30 tablet 5     No current facility-administered medications for this visit.       Allergies:  Allergies   Allergen Reactions    Oxycodone Dizziness    Ace Inhibitors Cough and Other (See Comments)     cough  cough         Past Medical History:  Past Medical History:   Diagnosis Date    Hypertension        Past Surgical History:  Past Surgical History:   Procedure Laterality Date    REDUCTION MAMMAPLASTY  2001       Family History:  Family History   Problem Relation Age of Onset    Hypertension Mother     Diabetes Mother     Hypertension Father     Hypertension Sister     Diabetes Sister     Cancer Maternal Grandmother         breast and colon       Social History:  Social History     Socioeconomic History    Marital status: Married   Tobacco Use    Smoking status: Never    Smokeless tobacco: Never   Vaping Use    Vaping Use: Never used   Substance and Sexual Activity    Alcohol use: Yes     Alcohol/week: 2.0 - 3.0  standard drinks of alcohol     Types: 2 - 3 Standard drinks or equivalent per week    Drug use: Never    Sexual activity: Yes     Partners: Male     Social Determinants of Health     Financial Resource Strain: Low Risk  (02/25/2022)    Overall Financial Resource Strain (CARDIA)     Difficulty of Paying Living Expenses: Not hard at all   Food Insecurity: Unknown (02/25/2022)    Hunger Vital Sign     Worried About Running Out of Food in the Last Year: Patient refused     Ran Out of Food in the Last Year: Patient refused   Transportation Needs: No Transportation Needs (02/25/2022)    PRAPARE - Therapist, art (Medical): No     Lack of Transportation (Non-Medical): No   Physical Activity: Unknown (02/25/2022)    Exercise Vital Sign     Days of Exercise per Week: 3 days   Stress: Stress Concern Present (02/25/2022)    [employer] of Occupational Health - Occupational Stress Questionnaire     Feeling of Stress : Very much   Social Connections: Unknown (02/25/2022)    Social Connection and Isolation Panel [NHANES]     Frequency of Communication with Friends and Family: Patient refused     Frequency of Social Gatherings with Friends and Family: More than three times a week     Attends Religious Services: Patient refused     Active Member of Clubs or Organizations: Yes     Attends Banker Meetings: Patient refused     Marital Status: Married   Catering manager Violence: Not At Risk (02/25/2022)    Humiliation, Afraid, Rape, and Kick questionnaire     Fear of Current or Ex-Partner: No     Emotionally Abused: No     Physically Abused: No     Sexually Abused: No   Housing Stability: Unknown (02/25/2022)    Housing Stability Vital  Sign     Unable to Pay for Housing in the Last Year: No     Unstable Housing in the Last Year: No         ROS:  General/Constitutional:           Denies Chills.  Denies Fatigue.  Denies Fever.       Ophthalmologic:           Denies Blurred vision.       ENT:            Denies Nasal Discharge.  Denies Sinus pain.  Denies Sore throat.       Endocrine:           Denies Polydipsia.  Denies Polyuria.       Respiratory:           Denies Cough.  Denies Orthopnea.  Denies Shortness of breath.  Denies Wheezing.       Cardiovascular:           Denies Chest pain.  Denies Chest pain with exertion.  Denies Palpitations.  Denies Swelling in hands/feet.       Gastrointestinal:           Denies Abdominal pain.  Denies Constipation.  Denies Diarrhea.  Denies Nausea.  Denies Vomiting.       Hematology:           Denies Easy bruising.  Denies Easy Bleeding.       Genitourinary:           Denies Blood in urine.  Denies Painful urination.       Peripheral Vascular:           Denies Pain/cramping in legs after exertion.  Denies Painful extremities.       Skin:           Denies Rash.       Neurologic:           Denies Dizziness.  Denies Pre-Syncope.  Denies Tingling/Numbness.       Psychiatric:           Denies Anxiety.  Denies Depressed mood.       Objective:     Vitals:  BP 148/78 (BP Site: Left arm, Patient Position: Sitting, Cuff Size: Large)   Pulse 65   Temp 97.9 F (36.6 C) (Temporal)   Ht 1.727 m (5\' 8" )   Wt 84.7 kg (186 lb 12.8 oz)   BMI 28.40 kg/m     Examination:   General Examination:  GENERAL APPEARANCE: alert, in no acute distress, well developed, well nourished, oriented to time, place, and person.   HEAD: normal appearance.   EYES: extraocular movement intact (EOMI), pupils equal, round, reactive to light and accommodation, sclera anicteric.   EARS: tympanic membranes normal bilaterally, external canals normal .   NOSE: normal nasal mucosa, nares patent.   ORAL CAVITY: normal oropharynx.   THROAT: no erythema, no exudate.   NECK/THYROID: neck supple, carotid pulse 2+ bilaterally, no lymphadenopathy, no thyromegaly.   SKIN: no rashes.   HEART: S1, S2 normal, no murmurs, rubs, gallops, regular rate and rhythm.   LUNGS: normal effort / no distress, normal breath sounds, clear to  auscultation bilaterally, no wheezes, rales, rhonchi.   ABDOMEN: bowel sounds present, no hepatosplenomegaly, soft, nontender, nondistended.   EXTREMITIES: no clubbing, cyanosis, or edema.   PERIPHERAL PULSES: 2+ dorsalis pedis, 2+ posterior tibial.   NEUROLOGIC: nonfocal, cranial nerves 2-12 grossly intact, normal strength, tone and reflexes,  sensory exam intact.   PSYCH: alert, oriented, cognitive function intact.    Assessment:     1. Pre-op examination  - ECG 12 lead  - Comprehensive metabolic panel  - CBC and differential  - Prothrombin time/INR  - APTT    2. Screening for colorectal cancer  - ECG 12 lead  - Comprehensive metabolic panel  - CBC and differential  - Prothrombin time/INR  - APTT    3. Benign essential hypertension  - Comprehensive metabolic panel    4. Hypercholesterolemia    5. Prediabetes      Plan:       1. Pre-op examination  2. Screening for colorectal cancer  Surgery Specific Risk:  Low (endoscopic, superficial, breast, opthalmologic, minor orthopedic)  - Electrocardiogram:   normal EKG, normal sinus rhythm, no acute ST-T changes, there are no previous tracings available for comparison .  - Chronic medical condition(s):   Hypertension is controlled on the current regimen.  - Exercise tolerance is greater than 4 mets.   - Pt is at low risk for peri-operative cardiovascular complications during planned low risk surgical procedure.  -We will await pending labs prior to clearing patient for colonoscopy.  - ECG 12 lead  - Comprehensive metabolic panel  - CBC and differential  - Prothrombin time/INR  - APTT    3. Benign essential hypertension  -Stable  -Manual repeat this visit is within goal.  Recommend ambulatory blood pressure monitoring to assist with management of high blood pressure.  Blood pressure goal is under 140/80.  - Continue to optimize low salt diet and aerobic exercise efforts. and Continue current medication regimen.   -Recommend optimizing therapeutic lifestyle changes which  include obtaining at least 150 minutes of aerobic exercise per week and eating a heart healthy diet ( i.e. DASH diet - www.heart.org or PopSteam.is ). Recommend out-of-office blood pressure measurements for titration of BP-lowering medication. Recommend checking ambulatory blood pressures once or twice a day and document in a log.  Bring the log and blood pressure cuff into the office at the next follow-up visit or utilize MyChart to communicate BP readings every 2-4 weeks.  - Comprehensive metabolic panel    4. Hypercholesterolemia  -Chronic  -Continue current dose of pravastatin and recommend maximizing lifestyle modifications before considering medication dose change.  -To help improve your cholesterol recommend aerobic exercising such as power walking, jogging, elliptical or exercise bike/cycling.  To improve your cholesterol with diet changes recommend avoiding cooking with oils containing complex fats such as coconut oil, margarine and butter.  Olive oil is to healthy as if you are going to cook with oils.  Recommend avoiding marbled meats such as steak and ribs which have stranding of fats through them.  Chicken without the skin [place] without the skin and fish orderliness.  Recommend choosing lean ground meats.  Avoid fast food and fried foods.    5. Prediabetes  -Stable        Return in about 6 months (around 12/25/2022) for Physical or sooner for any acute concerns.    Earnestine Leys, MD            Addendum:  June 25, 2022        Laboratory results resulted after this visit and reviewed by myself. Labs results were discussed with patient and addressed accordingly. Patient is cleared for surgery with acceptable risk.     Earnestine Leys, MD.

## 2022-06-25 ENCOUNTER — Encounter (INDEPENDENT_AMBULATORY_CARE_PROVIDER_SITE_OTHER): Payer: Self-pay | Admitting: Family Medicine

## 2022-06-25 NOTE — Progress Notes (Signed)
CBC (complete blood count) shows that you are not anemic and your white blood cell count and platelet count are within acceptable limits.  Comprehensive Metabolic Panel (CMP) which includes liver function, kidney function, electrolytes, and glucose is within acceptable limits.   PT/INR and PTT which are all clotting factors, are within the normal range.    I will complete your Pre-Op clearance paperwork and Fax your clearance to your surgeon.

## 2022-07-06 ENCOUNTER — Encounter (INDEPENDENT_AMBULATORY_CARE_PROVIDER_SITE_OTHER): Payer: Self-pay

## 2022-07-17 ENCOUNTER — Ambulatory Visit: Payer: Medicare Other

## 2022-07-17 NOTE — PSS Phone Screening (Signed)
Pre-Anesthesia Evaluation    Pre-op phone visit requested by:   Reason for pre-op phone visit: Patient anticipating COLONOSCOPY procedure.         No orders of the defined types were placed in this encounter.      History of Present Illness/Summary:        Problem List:  Medical Problems       Hospital Problem List  Date Reviewed: 06/24/2022   None        Non-Hospital Problem List  Date Reviewed: 06/24/2022            ICD-10-CM Priority Class Noted    Vitamin D deficiency E55.9   06/11/2013    Overview Signed 06/29/2021  1:31 PM by Lissa Morales     Formatting of this note might be different from the original.  IMPRESSION: We will call patient and ask her/him  to start taking Vitamin D 16109 IU two times PER WEEK for 2 months. We will repeat the blood levels then. The  Rx will be called to the pharmacy.  After finishing the rx of Vitamin D he/she needs to start taking otc Vit D 2000 IU/per day as maintenance  Formatting of this note might be different from the original.  Overview:   IMPRESSION: We will call patient and ask her/him  to start taking Vitamin D 60454 IU two times PER WEEK for 2 months. We will repeat the blood levels then. The  Rx will be called to the pharmacy.  After finishing the rx of Vitamin D he/she needs to start taking otc Vit D 2000 IU/per day as maintenance  Formatting of this note might be different from the original.  Overview:   IMPRESSION: We will call patient and ask her/him  to start taking Vitamin D 09811 IU two times PER WEEK for 2 months. We will repeat the blood levels then. The  Rx will be called to the pharmacy.  After finishing the rx of Vitamin D he/she needs to start taking otc Vit D 2000 IU/per day as maintenance         Obstructive sleep apnea hypopnea, moderate G47.33   05/10/2016    Overview Signed 06/29/2021  1:31 PM by Lissa Morales     Formatting of this note might be different from the original.  Unable to use the cpap         Major depressive disorder F32.9   03/03/2018    Kidney  stone N20.0   08/09/2016    Hypercholesterolemia E78.00   06/11/2013    Overview Signed 06/29/2021  1:31 PM by Lissa Morales     Formatting of this note might be different from the original.  IMPRESSION: The goal is to have your total cholesterol < 200, the HDL (good cholesterol) >40, and the LDL (bad cholesterol) <100.`E1o3L`It is recomended that you follow a good low fat diet and exercise for 30 minutes 3-4 times a week.  Formatting of this note might be different from the original.  Overview:   IMPRESSION: The goal is to have your total cholesterol < 200, the HDL (good cholesterol) >40, and the LDL (bad cholesterol) <100.`E1o3L`It is recomended that you follow a good low fat diet and exercise for 30 minutes 3-4 times a week.  Formatting of this note might be different from the original.  Overview:   IMPRESSION: The goal is to have your total cholesterol < 200, the HDL (good cholesterol) >40, and the LDL (bad cholesterol) <100.`E1o3L`It is recomended that you  follow a good low fat diet and exercise for 30 minutes 3-4 times a week.         Herniated cervical disc M50.20   04/24/2012    Primary insomnia F51.01   05/09/2016    Chronic constipation K59.09   08/09/2016    Benign essential hypertension I10   07/12/2014    Overview Signed 06/29/2021  1:31 PM by Lissa Morales     Last Assessment & Plan:   Formatting of this note might be different from the original.  Hypertension is improving with treatment.  Medication changes per orders.  Blood pressure will be reassessed in 2 weeks.f/u as scheduled.   Stop ACE, start back on hctz to see if cough con tto decrease and if tol HCTZ again. Has appt next week  Formatting of this note might be different from the original.  Last Assessment & Plan:   Hypertension is improving with treatment.  Medication changes per orders.  Blood pressure will be reassessed in 2 weeks.f/u as scheduled.   Stop ACE, start back on hctz to see if cough con tto decrease and if tol HCTZ again. Has appt next  week  Formatting of this note might be different from the original.  Last Assessment & Plan:   Hypertension is improving with treatment.  Medication changes per orders.  Blood pressure will be reassessed in 2 weeks.f/u as scheduled.   Stop ACE, start back on hctz to see if cough con tto decrease and if tol HCTZ again. Has appt next week         Allergic rhinitis due to pollen J30.1   08/09/2016    Acid reflux K21.9   08/09/2016    History of hypertension Z86.79   06/06/2022    Family history of colon cancer Z80.0   06/06/2022        Medical History   Diagnosis Date    Anxiety     NO MED    Bilateral cataracts     BILAT- REMOVED    Depression     NO MED    Family history of colon cancer     PREOP DIAGNOSIS    Gastroesophageal reflux disease     OMEPRAZOLE PRN    Hyperlipidemia     Hypertension     Screen for colon cancer     PREOP DIAGNOSIS     Past Surgical History:   Procedure Laterality Date    BACK SURGERY  01/2016    LUMBAR DISC    REDUCTION MAMMAPLASTY  2001        Medication List            Accurate as of July 17, 2022 12:07 PM. Always use your most recent med list.              Some of the medications listed here do not show instructions, such as how often to take the medication. Ask your doctor or nurse how to use these medications.  Specifically ask about these and similar medications:   pravastatin (PRAVACHOL) 40 MG tablet  hydroCHLOROthiazide (HYDRODIURIL) 25 MG tablet       diphenhydrAMINE-acetaminophen 25-500 MG Tabs  Take 1 tablet by mouth nightly as needed  Commonly known as: TYLENOL PM  Medication Adjustments for Surgery: Take as needed     hydroCHLOROthiazide 25 MG tablet  __________________________  Commonly known as: HYDRODIURIL  Medication Adjustments for Surgery: Take morning of surgery     MELATONIN PO  Take by mouth nightly  as needed  Medication Adjustments for Surgery: Take as needed     omeprazole 20 MG capsule  Take 1 capsule (20 mg) by mouth daily as needed  Commonly known as:  PriLOSEC  Medication Adjustments for Surgery: Take as needed     polyethylene glycol 17 GM/SCOOP powder  Take by mouth  Commonly known as: MIRALAX  Medication Adjustments for Surgery: Hold day of surgery     pravastatin 40 MG tablet  __________________________  Commonly known as: PRAVACHOL  Notes to patient: TAKE PM BEFORE SURGERY     tiZANidine 4 MG tablet  Take 1 tablet (4 mg) by mouth every 6 (six) hours as needed (back spasm)  Commonly known as: ZANAFLEX  Medication Adjustments for Surgery: Take as needed     VITAMIN D PO  Take by mouth every morning  Medication Adjustments for Surgery: Hold day of surgery            Allergies   Allergen Reactions    Oxycodone Dizziness    Ace Inhibitors Other (See Comments) and Cough     Cough-LISINOPRIL  cough       Family History   Problem Relation Age of Onset    Hypertension Mother     Diabetes Mother     Hypertension Father     Hypertension Sister     Diabetes Sister     Cancer Maternal Grandmother         breast and colon     Social History     Occupational History    Not on file   Tobacco Use    Smoking status: Never    Smokeless tobacco: Never   Vaping Use    Vaping Use: Never used   Substance and Sexual Activity    Alcohol use: Yes     Alcohol/week: 1.0 - 2.0 standard drink of alcohol     Types: 1 - 2 Standard drinks or equivalent per week    Drug use: Never    Sexual activity: Yes     Partners: Male       Menstrual History:   LMP / Status  Postmenopausal     No LMP recorded. Patient is postmenopausal.    Tubal Ligation?  No valid surgical or medical questions entered.             Exam Scores:   SDB score Risk Category: OSA Diagnosed No PAP    PONV score Nausea Risk: MODERATE RISK    MST score MST Score: 0    Allergy score Risk Category: Low Risk    Frailty score CFS Score: 3       Visit Vitals  Ht 1.727 m (5\' 8" )   Wt 83.9 kg (185 lb)   BMI 28.13 kg/m       Recent Labs   CBC (last 180 days) 03/12/22  0913 06/24/22  1100   WBC 6.2 5.60   RBC 4.27 4.46   Hemoglobin  11.8  --    Hgb  --  12.0   Hematocrit 36.1 39.0   MCV 85 87.4   MCH 27.6 26.9   MCHC 32.7 30.8*   RDW 11.8 13   Platelets 253 284   MPV  --  9.4   Nucleated RBC  --  0.0   Absolute NRBC  --  0.00     Recent Labs   BMP (last 180 days) 03/12/22  0913 06/24/22  1100   Glucose 87 93   BUN 16  9.0   Creatinine 1.02* 0.8   Sodium 138 140   Potassium 4.4 4.3   Chloride 98 102   CO2 28 30*   Calcium 9.8 9.9   Anion Gap  --  8.0   eGFR 60 >60.0     Recent Labs   Coag Panel (last 180 days) 06/24/22  1100   PTT 29   PT 10.6   PT INR 0.9     Recent Labs   Other (last 180 days) 03/12/22  0913 06/24/22  1100   Bilirubin, Total 0.2 0.3   ALT 21 26   AST (SGOT) 25 26   Protein, Total 7.2 7.5   Hemoglobin A1C 5.6  --        IN EPIC 06/25/27-EKG ABN- SB VR 51, CBC, CMP, PT/INR/PTT WDL. PCP PREOP EVAL

## 2022-07-23 ENCOUNTER — Encounter
Admission: RE | Disposition: A | Discharge status: Home / Self Care | Payer: Self-pay | Source: Ambulatory Visit | Attending: Internal Medicine

## 2022-07-23 ENCOUNTER — Ambulatory Visit: Payer: Self-pay

## 2022-07-23 ENCOUNTER — Encounter: Payer: Self-pay | Admitting: Internal Medicine

## 2022-07-23 ENCOUNTER — Ambulatory Visit: Payer: Medicare Other | Admitting: Anesthesiology

## 2022-07-23 ENCOUNTER — Ambulatory Visit
Admission: RE | Admit: 2022-07-23 | Discharge: 2022-07-23 | Disposition: A | Discharge status: Home / Self Care | Payer: Medicare Other | Source: Ambulatory Visit | Attending: Internal Medicine | Admitting: Internal Medicine

## 2022-07-23 DIAGNOSIS — Z8679 Personal history of other diseases of the circulatory system: Secondary | ICD-10-CM

## 2022-07-23 DIAGNOSIS — Z1211 Encounter for screening for malignant neoplasm of colon: Secondary | ICD-10-CM

## 2022-07-23 DIAGNOSIS — K5909 Other constipation: Secondary | ICD-10-CM

## 2022-07-23 DIAGNOSIS — Z800 Family history of malignant neoplasm of digestive organs: Secondary | ICD-10-CM

## 2022-07-23 DIAGNOSIS — K640 First degree hemorrhoids: Secondary | ICD-10-CM | POA: Insufficient documentation

## 2022-07-23 DIAGNOSIS — Z79899 Other long term (current) drug therapy: Secondary | ICD-10-CM | POA: Insufficient documentation

## 2022-07-23 DIAGNOSIS — K635 Polyp of colon: Secondary | ICD-10-CM | POA: Insufficient documentation

## 2022-07-23 HISTORY — PX: COLONOSCOPY: SHX174

## 2022-07-23 HISTORY — PX: COLONOSCOPY, DIAGNOSTIC (SCREENING): SHX174

## 2022-07-23 SURGERY — DONT USE, USE 1094-COLONOSCOPY, DIAGNOSTIC (SCREENING)
Anesthesia: Anesthesia General | Site: Anus | Wound class: Clean Contaminated

## 2022-07-23 MED ORDER — LACTATED RINGERS IV SOLN
INTRAVENOUS | Status: DC
Start: 2022-07-23 — End: 2022-07-23

## 2022-07-23 MED ORDER — PROPOFOL INFUSION 10 MG/ML
INTRAVENOUS | Status: DC | PRN
Start: 2022-07-23 — End: 2022-07-23
  Administered 2022-07-23: 125 ug/kg/min via INTRAVENOUS

## 2022-07-23 MED ORDER — LIDOCAINE 1% BUFFERED - CNR/OUTSOURCED
0.3000 mL | Freq: Once | INTRAMUSCULAR | Status: AC
Start: 2022-07-23 — End: 2022-07-23
  Administered 2022-07-23: 0.3 mL via INTRADERMAL
  Filled 2022-07-23: qty 1

## 2022-07-23 MED ORDER — LIDOCAINE HCL 2 % IJ SOLN
INTRAMUSCULAR | Status: DC | PRN
Start: 2022-07-23 — End: 2022-07-23
  Administered 2022-07-23: 60 mg

## 2022-07-23 MED ORDER — PROPOFOL 10 MG/ML IV EMUL (WRAP)
INTRAVENOUS | Status: AC
Start: 2022-07-23 — End: ?
  Filled 2022-07-23: qty 120

## 2022-07-23 MED ORDER — LIDOCAINE HCL 2 % IJ SOLN
INTRAMUSCULAR | Status: AC
Start: 2022-07-23 — End: ?
  Filled 2022-07-23: qty 40

## 2022-07-23 SURGICAL SUPPLY — 74 items
BASIN EME PLS 700ML LF GRAD TRNLU PGMNT (Patient Supply) ×1
BASIN EMESIS 700 ML GRADUATED TRANSLUCENT PIGMENT FREE PLASTIC (Patient Supply) IMPLANT
BRUSH ENDOSCOPIC CLEANING SLIM 2 BUNDLE (Endoscopic Supplies) ×1
BRUSH ENDOSCOPIC CLEANING SLIM 2 BUNDLE CATHETER ROBUST VALVE OD5 MM (Endoscopic Supplies) ×1 IMPLANT
BRUSH ESCP CLN SLIM HEDGEHOG 5MM 2 BNDL (Endoscopic Supplies) ×1
CATHETER ELHMST HMGLD GLDPRB 10FR 300CM (Component) IMPLANT
CATHETER ELHMST HMGLD INJ GLDPRB 10FR 25 (Catheter Micellaneous)
CATHETER OD10 FR ODSEC25 GA ID.24 MM (Catheter Micellaneous)
CATHETER OD10 FR ODSEC25 GA ID.24 MM L210 CM BIPOLAR ROUND DISTAL TIP (Catheter Miscellaneous) IMPLANT
CONTAINER HISTOLOGY 60 ML 30 ML GRADUATE LEAK RESISTANT O RING PREFILL (Procedure Accessories) IMPLANT
ELECTRODE ADULT PATIENT RETURN L9 FT REM POLYHESIVE ACRYLIC FOAM (Procedure Accessories) IMPLANT
ELECTRODE PATIENT RETURN L9 FT VALLEYLAB (Procedure Accessories)
ELECTRODE PT RTN RM PHSV ACRL FM C30- LB (Procedure Accessories)
FORCEPS BIOPSY L240 CM +2.8 MM HOT OD2.2 (Endoscopic Supplies)
FORCEPS BIOPSY L240 CM +2.8 MM HOT OD2.2 MM RADIAL JAW (Endoscopic Supplies) IMPLANT
FORCEPS BIOPSY L240 CM JUMBO MICROMESH (Instrument)
FORCEPS BIOPSY L240 CM JUMBO MICROMESH TEETH STREAMLINE CATHETER (Instrument) IMPLANT
FORCEPS BIOPSY L240 CM LARGE CAPACITY (Instrument)
FORCEPS BIOPSY L240 CM MICROMESH TEETH STREAMLINE CATHETER NEEDLE (Instrument) IMPLANT
FORCEPS BIOPSY L240 CM STANDARD CAPACITY (Instrument)
FORCEPS BX +2.8MM RJ 4 2.2MM 240CM HOT (Endoscopic Supplies)
FORCEPS BX SS JMB RJ 4 2.8MM 240CM STRL (Instrument)
FORCEPS BX SS LG CPC RJ 4 2.4MM 240CM (Instrument)
FORCEPS BX STD CPC RJ 4 2.2MM 240CM STRL (Instrument)
GOWN ISL SMS XL LF HKLP NK KNIT CUF BLU (Patient Supply) ×2
GOWN ISOLATION XL HOOK LOOP NECK KNIT (Patient Supply) ×2 IMPLANT
KIT ENDO W/ FOUR PACK BUTTONS (Kits) ×1
KIT ENDOSCOPIC COMPLIANCE ENDOKIT (Kits) ×1
KIT ENDOSCOPIC COMPLIANCE ENDOKIT ORCAPOD 4 1.1 OZ (Kits) ×1 IMPLANT
LIFTER SRG 10ML ELEVIEW 5X5.1X1.2IN INJ (Endoscopic Supplies) IMPLANT
MANIFOLD SCT 2 STD NPTN 2 LF NS 4 PORT (Filter) ×1
MANIFOLD SUCTION 2 STANDARD 4 PORT (Filter) ×1
MANIFOLD SUCTION 2 STANDARD 4 PORT NEPTUNE 2 WASTE MANAGEMENT SYSTEM (Filter) ×1 IMPLANT
MARKER ENDOSCOPIC PERMANENT INDICATION (Syringes, Needles)
MARKER ENDOSCOPIC PERMANENT INDICATION DARK SYRINGE SPOT EX 5 ML (Syringes, Needles) IMPLANT
MARKER ESCP 5ML SPOT EX PERM INDCT DRK (Syringes, Needles)
NEEDLE SCLEROTHERAPY CARR-LOCKE OD25 GA ODSEC2.5 MM L230 CM INJECTION (Needles) IMPLANT
NEEDLE SCLEROTHERAPY OD25 GA ODSEC2.5 MM (Needles)
NEEDLE SCLRTX SS TFLN CRLK 25GA 2.5MM (Needles)
NET SPEC RTRVL STD RTHNT 2.5MM 230CM LF (Urology Supply)
NET SPECIMEN RETRIEVAL L230 CM STANDARD (Urology Supply)
NET SPECIMEN RETRIEVAL L230 CM STANDARD SHEATH OD2.5 MM L6 CM X W3 CM (Urology Supply) IMPLANT
PAD TRANSPORT L42 IN X W17 IN (Procedure Accessories)
PAD TRANSPORT L42 IN X W17 IN TRANSLUCENT LEAK PROOF ABSORBENT (Procedure Accessories) IMPLANT
PAD TRNSPT CINCHPAD 42X17IN TRNLU LEK (Procedure Accessories)
PROBE COAG FIAPC 6.9FR 7.2FT CRCMF PLG (Endoscopic Supplies)
PROBE COAGULATION L7.2 FT (Endoscopic Supplies)
PROBE COAGULATION L7.2 FT CIRCUMFERENTIAL PLUG PLAY FUNCTIONALITY (Endoscopic Supplies) IMPLANT
PROBE ELECTROSURGICAL L220 CM FLEXIBLE (Procedure Accessories)
PROBE ELECTROSURGICAL L220 CM FLEXIBLE STRAIGHT FIRE OD2.3 MM FIAPC (Procedure Accessories) IMPLANT
PROBE ESURG FIAPC 2.3MM 220CM STRL FLXB (Procedure Accessories)
SNARE 2.8 CM ROUND L240 CM OD10 MM (Endoscopic Supplies)
SNARE 2.8 CM ROUND L240 CM OD10 MM ODSEC2.4 MM CAPTIVATOR LOOP STIFF (Endoscopic Supplies) IMPLANT
SNARE 9 MM L230 CM OD2.4 MM COLD BRAID (Instrument)
SNARE 9 MM L230 CM OD2.4 MM EXACTO COLD BRAID WIRE CLEAN CUT (Instrument) IMPLANT
SNARE ESCP 2.8CM RND CPTVTR II 10MM 2.4 (Endoscopic Supplies)
SNARE ESCP 9MM EXACTO 2.4MM 230CM LF (Instrument)
SNARE ESCP MED OVL CPTFLX 2.4MM 240CM (Endoscopic Supplies)
SNARE ESCP MIC CPTVTR 13MM 240IN STRL (GE Lab Supplies)
SNARE MD OVAL 240CM 2.4 MM CPTFLX LP FLXBL ENDOSCOPIC POLYPECTOMY 27MM (Endoscopic Supplies) IMPLANT
SNARE MEDIUM OVAL L240 CM OD2.4 MM (Endoscopic Supplies)
SNARE SMALL HEXAGON CAPTIVATOR STIFF ENDOSCOPIC POLYPECTOMY (GE Lab Supplies) IMPLANT
SOL FORMALIN 10% PREFILL 30ML (Procedure Accessories) ×1
SYRINGE 50 ML GRADUATE NONPYROGENIC DEHP (Syringes, Needles) ×1
SYRINGE 50 ML GRADUATE NONPYROGENIC DEHP FREE PVC FREE BD MEDICAL (Syringes, Needles) IMPLANT
SYRINGE MED 50ML LF STRL GRAD N-PYRG (Syringes, Needles) ×1
TIP COLONOSCOPE LARGE FLEXIBLE ARM ID11.2 MM ENDOCUFF VISION GREEN (Procedure Accessories) IMPLANT
TIP CSCP LG ENDOCUFF VSN 11.2MM LF STRL (Procedure Accessories) ×1
TRAP INLINE SUCTION CHAMBER FLAT SURFACE (Endoscopic Supplies)
TRAP INLINE SUCTION CHAMBER FLAT SURFACE QUICK CATCH SPECIMEN JAR (Endoscopic Supplies) IMPLANT
TRAP SPEC PLS QCK CTCH INLN SCT CHMBR (Endoscopic Supplies)
TUBING SCT PVC ARG 3/16IN 10FT LF STRL (Tubing) ×1
TUBING SUCTION ID3/16 IN L10 FT (Tubing) ×1
TUBING SUCTION ID3/16 IN L10 FT NONCONDUCTIVE STRAIGHT MALE FEMALE (Tubing) ×1 IMPLANT

## 2022-07-23 NOTE — Anesthesia Preprocedure Evaluation (Signed)
Anesthesia Evaluation    AIRWAY    Mallampati: II    TM distance: >3 FB  Neck ROM: full  Mouth Opening:full  Planned to use difficult airway equipment: No CARDIOVASCULAR    regular and normal       DENTAL        (+) upper dentures             PULMONARY    pulmonary exam normal     OTHER FINDINGS                                        Relevant Problems   ANESTHESIA   (+) Obstructive sleep apnea hypopnea, moderate      PULMONARY   (+) Obstructive sleep apnea hypopnea, moderate      CARDIO   (+) Benign essential hypertension      GI   (+) Acid reflux      GU/RENAL   (+) Kidney stone               Anesthesia Plan    ASA 3     general                     intravenous induction   Detailed anesthesia plan: general IV        Post op pain management: per surgeon        Plan discussed with CRNA.    ECG reviewed  pertinent labs reviewed               Signed by: Feliberto Gottron, DO 07/23/22 7:17 AM
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
## 2022-07-23 NOTE — Discharge Instr - AVS First Page (Addendum)
Reason for your Hospital Admission:  ***  COLONOSCOPY w/ Bx's    Instructions for after your discharge:  ***         Endoscopy Discharge Instructions    LOWER GI STUDY    General Instructions:    1. Following sedation, your judgment, perception, and coordination are considered impaired. Even though you may feel awake and alert, you are considered legally intoxicated. Therefore, until the next morning:    * Do not drive    * Do not operate appliances or equipment that requires reaction time (e.g. stove, electrical tools, machinery)    * Do not sign legal documents or be involved in important decisions    * Do not drink alcoholic beverages    Go directly home and rest for several hours before resuming your routine activities    It is highly recommended to have a responsible adult stay with you for the next 24 hours    2. Tenderness, swelling, or pain may occur at the IV site where you received sedation. If you experience this, apply warm soaks to the area. Notify your physician if this persists.    3. If you have questions or problems contact your MD immediately. If you need immediate attention, call your MD, 911 and/or go to the nearest emergency room.    Report to the Physician any of the following:    For Colonoscopy, Sigmoidoscopy, or Proctoscopy:    * Severe and persistent abdominal pain/bloating which does not subside within 2-3 hours    * Large amount of rectal bleeding (some mucous blood streaking may occur, especially if biopsy or polypectomy was done or if hemorrhoids are present    * Nausea/vomiting    * Fever/chills within 24 hours after procedure. Temp > 101 degrees Farenheit    * If a biopsy or polyp has been removed, DO NOT take aspirin or aspirin-containing products (e.g. Anacin, Alka Seltzer, Bufferin, etc) or non-steroidal anti-inflammatory drugs (e.g. Advil, Motrin, etc) for 3 days unless otherwise advised by doctor. May take Tylenol as needed, not to exceed more than 4000 mg in a 24 hour period.

## 2022-07-23 NOTE — Anesthesia Postprocedure Evaluation (Signed)
Anesthesia Post Evaluation    Patient: Megan White    Procedure(s):  COLONOSCOPY w/ Bx's    Anesthesia type: general    Last Vitals:   Vitals Value Taken Time   BP 144/84 07/23/22 0820   Temp 36.7 C (98 F) 07/23/22 0805   Pulse 87 07/23/22 0805   Resp 16 07/23/22 0820   SpO2 98 % 07/23/22 0820                 Anesthesia Post Evaluation:     Patient Evaluated: PACU  Patient Participation: complete - patient participated  Level of Consciousness: awake and alert  Pain Score: 0  Pain Management: adequate    Airway Patency: patent        Anesthetic complications: No      PONV Status: none    Cardiovascular status: acceptable  Respiratory status: acceptable  Hydration status: acceptable          Signed by: Feliberto Gottron, DO, 07/23/2022 8:49 AM

## 2022-07-23 NOTE — Transfer of Care (Signed)
Anesthesia Transfer of Care Note    Patient: Megan White    Procedures performed: Procedure(s):  COLONOSCOPY w/ Bx's    Anesthesia type: MAC    Patient location:Phase II PACU    Last vitals:   Vitals:    07/23/22 0805   BP: 147/89   Pulse: 87   Resp:    Temp: 36.7 C (98 F)   SpO2: 98%       Post pain: Patient not complaining of pain, continue current therapy      Mental Status:awake    Respiratory Function: tolerating room air    Cardiovascular: stable    Nausea/Vomiting: patient not complaining of nausea or vomiting    Hydration Status: adequate    Post assessment: no apparent anesthetic complications    Signed by: Andrew Au, CRNA  07/23/22 8:06 AM

## 2022-07-23 NOTE — Discharge Instructions (Signed)
Post Anesthesia Discharge Instructions    Although you may be awake and alert in the recovery room, small amounts of anesthetic remain in your system for about 24 hours.  You may feel tired and sleepy during this time.      You are advised to go directly home from the hospital.    Plan to stay at home and rest for the remainder of the day.    It is advisable to have someone with you at home for 24 hours after surgery.    Do not operate a motor vehicle, or any mechanical or electrical equipment for the next 24 hours.      If you have not been able to urinate for 6-8 hours after your procedure - contact your surgeon. Be sure you are drinking adequate amounts of fluids.    Be careful when you are walking around, you may become dizzy.  The effects of anesthesia and/or medications are still present and drowsiness may occur    Do not consume alcohol, tranquilizers, sleeping medications, or any other non prescribed medication for the remainder of the day.    Diet:  begin with liquids, progress your diet as tolerated or as directed by your surgeon.  Nausea and vomiting may occur in the next 24 hours.

## 2022-07-25 LAB — LAB USE ONLY - HISTORICAL SURGICAL PATHOLOGY

## 2022-08-15 ENCOUNTER — Encounter (INDEPENDENT_AMBULATORY_CARE_PROVIDER_SITE_OTHER): Payer: Self-pay

## 2022-09-16 ENCOUNTER — Ambulatory Visit (INDEPENDENT_AMBULATORY_CARE_PROVIDER_SITE_OTHER): Payer: Medicare Other | Admitting: Family Medicine

## 2022-10-22 ENCOUNTER — Ambulatory Visit (INDEPENDENT_AMBULATORY_CARE_PROVIDER_SITE_OTHER): Payer: Medicare Other | Admitting: Family Medicine

## 2022-11-06 ENCOUNTER — Ambulatory Visit (INDEPENDENT_AMBULATORY_CARE_PROVIDER_SITE_OTHER): Payer: Medicare Other | Admitting: Family Medicine

## 2022-11-26 ENCOUNTER — Other Ambulatory Visit: Payer: Self-pay

## 2022-12-10 ENCOUNTER — Encounter (INDEPENDENT_AMBULATORY_CARE_PROVIDER_SITE_OTHER): Payer: Self-pay | Admitting: Family Medicine

## 2022-12-10 ENCOUNTER — Ambulatory Visit (INDEPENDENT_AMBULATORY_CARE_PROVIDER_SITE_OTHER): Payer: Medicare Other | Admitting: Family Medicine

## 2022-12-10 VITALS — BP 133/83 | HR 84 | Temp 97.8°F | Ht 66.54 in | Wt 196.4 lb

## 2022-12-10 DIAGNOSIS — M5441 Lumbago with sciatica, right side: Secondary | ICD-10-CM

## 2022-12-10 DIAGNOSIS — Z9889 Other specified postprocedural states: Secondary | ICD-10-CM

## 2022-12-10 DIAGNOSIS — M25561 Pain in right knee: Secondary | ICD-10-CM

## 2022-12-10 DIAGNOSIS — E7800 Pure hypercholesterolemia, unspecified: Secondary | ICD-10-CM

## 2022-12-10 DIAGNOSIS — Z9849 Cataract extraction status, unspecified eye: Secondary | ICD-10-CM

## 2022-12-10 DIAGNOSIS — Z0000 Encounter for general adult medical examination without abnormal findings: Secondary | ICD-10-CM

## 2022-12-10 DIAGNOSIS — I10 Essential (primary) hypertension: Secondary | ICD-10-CM

## 2022-12-10 DIAGNOSIS — H538 Other visual disturbances: Secondary | ICD-10-CM

## 2022-12-10 DIAGNOSIS — R7303 Prediabetes: Secondary | ICD-10-CM

## 2022-12-10 MED ORDER — HYDROCHLOROTHIAZIDE 25 MG PO TABS
25.0000 mg | ORAL_TABLET | Freq: Every day | ORAL | 1 refills | Status: DC
Start: 2022-12-10 — End: 2023-05-12

## 2022-12-10 MED ORDER — PRAVASTATIN SODIUM 40 MG PO TABS
40.0000 mg | ORAL_TABLET | Freq: Every evening | ORAL | 1 refills | Status: DC
Start: 2022-12-10 — End: 2023-05-12
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
## 2022-12-12 ENCOUNTER — Telehealth (INDEPENDENT_AMBULATORY_CARE_PROVIDER_SITE_OTHER): Payer: Self-pay | Admitting: Family Medicine

## 2022-12-12 NOTE — Telephone Encounter (Signed)
Patient is requesting for lab orders to be mailed to her.  Please advice     Call back # [phone]

## 2022-12-15 ENCOUNTER — Other Ambulatory Visit (INDEPENDENT_AMBULATORY_CARE_PROVIDER_SITE_OTHER): Payer: Self-pay | Admitting: Family Medicine

## 2022-12-15 DIAGNOSIS — I10 Essential (primary) hypertension: Secondary | ICD-10-CM

## 2022-12-16 ENCOUNTER — Encounter (INDEPENDENT_AMBULATORY_CARE_PROVIDER_SITE_OTHER): Payer: Self-pay

## 2022-12-16 NOTE — Telephone Encounter (Signed)
done 

## 2022-12-18 ENCOUNTER — Other Ambulatory Visit: Payer: Medicare Other

## 2022-12-18 DIAGNOSIS — I10 Essential (primary) hypertension: Secondary | ICD-10-CM

## 2022-12-18 DIAGNOSIS — Z0000 Encounter for general adult medical examination without abnormal findings: Secondary | ICD-10-CM

## 2022-12-18 DIAGNOSIS — E7800 Pure hypercholesterolemia, unspecified: Secondary | ICD-10-CM

## 2022-12-18 DIAGNOSIS — R7303 Prediabetes: Secondary | ICD-10-CM

## 2022-12-18 LAB — COMPREHENSIVE METABOLIC PANEL
ALT: 18 U/L (ref 0–55)
AST (SGOT): 22 U/L (ref 5–41)
Albumin/Globulin Ratio: 1.2 (ref 0.9–2.2)
Albumin: 4.3 g/dL (ref 3.5–5.0)
Alkaline Phosphatase: 40 U/L (ref 37–117)
Anion Gap: 8 (ref 5.0–15.0)
BUN: 18 mg/dL (ref 7.0–21.0)
Bilirubin, Total: 0.4 mg/dL (ref 0.2–1.2)
CO2: 30 mEq/L — ABNORMAL HIGH (ref 17–29)
Calcium: 10.1 mg/dL (ref 8.5–10.5)
Chloride: 101 mEq/L (ref 99–111)
Creatinine: 1 mg/dL (ref 0.4–1.0)
Globulin: 3.5 g/dL (ref 2.0–3.6)
Glucose: 92 mg/dL (ref 70–100)
Potassium: 4.3 mEq/L (ref 3.5–5.3)
Protein, Total: 7.8 g/dL (ref 6.0–8.3)
Sodium: 139 mEq/L (ref 135–145)
eGFR: >60 mL/min/{1.73_m2} (ref >=60)

## 2022-12-18 LAB — CBC AND DIFFERENTIAL
Absolute NRBC: 0 10*3/uL (ref 0.00–0.00)
Basophils Absolute Automated: 0.03 10*3/uL (ref 0.00–0.08)
Basophils Automated: 0.5 %
Eosinophils Absolute Automated: 0.04 10*3/uL (ref 0.00–0.44)
Eosinophils Automated: 0.6 %
Hematocrit: 39.7 % (ref 34.7–43.7)
Hgb: 12.5 g/dL (ref 11.4–14.8)
Immature Granulocytes Absolute: 0.01 10*3/uL (ref 0.00–0.07)
Immature Granulocytes: 0.2 %
Instrument Absolute Neutrophil Count: 2.16 10*3/uL (ref 1.10–6.33)
Lymphocytes Absolute Automated: 3.43 10*3/uL — ABNORMAL HIGH (ref 0.42–3.22)
Lymphocytes Automated: 53.9 %
MCH: 27.5 pg (ref 25.1–33.5)
MCHC: 31.5 g/dL (ref 31.5–35.8)
MCV: 87.3 fL (ref 78.0–96.0)
MPV: 9 fL (ref 8.9–12.5)
Monocytes Absolute Automated: 0.69 10*3/uL (ref 0.21–0.85)
Monocytes: 10.8 %
Neutrophils Absolute: 2.16 10*3/uL (ref 1.10–6.33)
Neutrophils: 34 %
Nucleated RBC: 0 /100 WBC (ref 0.0–0.0)
Platelets: 287 10*3/uL (ref 142–346)
RBC: 4.55 10*6/uL (ref 3.90–5.10)
RDW: 13 % (ref 11–15)
WBC: 6.36 10*3/uL (ref 3.10–9.50)

## 2022-12-18 LAB — LIPID PANEL
Cholesterol / HDL Ratio: 3.5 Index
Cholesterol: 205 mg/dL — ABNORMAL HIGH (ref 0–199)
HDL: 58 mg/dL (ref 40–9999)
LDL Calculated: 129 mg/dL — ABNORMAL HIGH (ref 0–99)
Triglycerides: 88 mg/dL (ref 34–149)
VLDL Calculated: 18 mg/dL (ref 10–40)

## 2022-12-18 LAB — HEMOGLOBIN A1C
Average Estimated Glucose: 111.2 mg/dL
Hemoglobin A1C: 5.5 % (ref 4.6–5.6)

## 2022-12-18 LAB — HEMOLYSIS INDEX: Hemolysis Index: 2 Index (ref 0–24)

## 2022-12-19 NOTE — Progress Notes (Signed)
CBC (complete blood count) shows that you are not anemic and your white blood cell count and platelet count are within acceptable limits.  Comprehensive Metabolic Panel (CMP) which includes liver function, kidney function, electrolytes, and glucose is within acceptable limits.   A1C (3 month avg sugar screening for diabetes) is within the normal range.   Total and bad (LDL) cholesterol are higher than the normal range.  No medication change but your numbers have increased from 9 months ago.  Recommend working on healthy low fat diet and exercise to help improve your cholesterol and lower your risk of cardiovascular disease (strokes and heart attack) before we consider increasing your cholesterol medication.  Recommend omega-3 fatty acid supplements, a diet that is high in nuts, soy based products, lean meats (such as poultry and fish) and green teas.  Avoid cooking with oils (olive oil is the healthiest of the oils), margarine or butter.  Avoid fast food or fried foods.    Follow up appointment in 6 months or sooner for any new concerns

## 2022-12-26 ENCOUNTER — Ambulatory Visit (INDEPENDENT_AMBULATORY_CARE_PROVIDER_SITE_OTHER): Payer: Medicare Other

## 2022-12-26 DIAGNOSIS — I10 Essential (primary) hypertension: Secondary | ICD-10-CM

## 2022-12-26 DIAGNOSIS — E785 Hyperlipidemia, unspecified: Secondary | ICD-10-CM

## 2022-12-30 ENCOUNTER — Ambulatory Visit (INDEPENDENT_AMBULATORY_CARE_PROVIDER_SITE_OTHER): Payer: Medicare Other

## 2022-12-30 ENCOUNTER — Ambulatory Visit (INDEPENDENT_AMBULATORY_CARE_PROVIDER_SITE_OTHER): Payer: Self-pay

## 2022-12-30 DIAGNOSIS — I10 Essential (primary) hypertension: Secondary | ICD-10-CM

## 2022-12-30 DIAGNOSIS — E785 Hyperlipidemia, unspecified: Secondary | ICD-10-CM

## 2023-01-08 ENCOUNTER — Ambulatory Visit (HOSPITAL_BASED_OUTPATIENT_CLINIC_OR_DEPARTMENT_OTHER): Payer: Medicare Other | Admitting: Internal Medicine

## 2023-01-08 VITALS — BP 140/84 | HR 90 | Temp 98.0°F | Ht 67.0 in | Wt 194.0 lb

## 2023-01-08 DIAGNOSIS — I10 Essential (primary) hypertension: Secondary | ICD-10-CM

## 2023-01-08 DIAGNOSIS — K5900 Constipation, unspecified: Secondary | ICD-10-CM

## 2023-01-08 DIAGNOSIS — R1031 Right lower quadrant pain: Secondary | ICD-10-CM

## 2023-01-08 DIAGNOSIS — E7800 Pure hypercholesterolemia, unspecified: Secondary | ICD-10-CM

## 2023-01-08 DIAGNOSIS — E6609 Other obesity due to excess calories: Secondary | ICD-10-CM

## 2023-01-08 DIAGNOSIS — G4733 Obstructive sleep apnea (adult) (pediatric): Secondary | ICD-10-CM

## 2023-01-08 DIAGNOSIS — Z6830 Body mass index (BMI) 30.0-30.9, adult: Secondary | ICD-10-CM

## 2023-01-08 MED ORDER — BUPROPION HCL ER (XL) 150 MG PO TB24
150.0000 mg | ORAL_TABLET | Freq: Every morning | ORAL | 2 refills | Status: DC
Start: 2023-01-08 — End: 2023-05-30

## 2023-01-08 NOTE — Patient Instructions (Addendum)
It was so nice to meet you! Please feel free to send me a message on Mychart if you have any questions or concerns.     If we ordered blood tests, please get your blood drawn for lab work. For most tests we ask that you fast (no food) for 8 hours before labs, but you can drink water and take medicines.     It is important for you to track your food intake. I would use an app like myfitnesspal, lose it, or It is important for you to track your food intake. I would use an app like myfitnesspal, lose it, or baritastic.   Get at least 80-100 grams of protein per day  Aim for less than 100 grams of carbohydrates per day. Primarily you should be getting carbohydrates from non-starchy vegetables, 1-2 servings of fruit daily, beans, legumes, and whole grains. Eventually we can gradually decrease your carbohydrates depending on how you respond.   The rest of your calories from healthy fats such as fish (tuna, salmon, herring), avocado, eggs, seeds/nuts, dairy (yogurt without sugar added, cheese, sour cream), olives/olive oil, etc. You should eat these foods to help with satiety, but you don't have to add them in if you already feel satisfied.     General advice for losing fat and not muscle-   When you say that you would like to "lose weight," you really mean that you would like to lose fat. We call this adipose tissue. However, when you lose weight you can also lose other types of tissue. Initially when you start changing your diet you lose "water weight." As you eat less calories than you burn, your body will use the carbohydrate that is stored in your muscle and liver for fuel. This is called glycogen, and glycogen is stored with water. So when the glycogen is used, the water is released.     We do not want you to lose muscle or bone when you are losing weight. There is only so much you can do to prevent this, so it is very important to do what you can. The following will help reduce muscle and bone loss.     1. Have  enough vitamin D to protect your bones. Many patients with excess weight have low vitamin D levels. Taking a supplement can help. Often we will check your level and recommend a supplement.    2. Eat enough protein. The types of "calories" that you eat matter. Protein in your diet is used to repair your muscle tissue, and to make the building blocks for a lot of other things in your body like hormones and enzymes. It is also an important signal that tells your body that you are getting enough overall nutrition.   - If you do not eat enough protein in your diet, your body will use the protein it already has to do these things. This means that you can start to break down the muscle tissue to get those building blocks.   - When you lose muscle, not only are you weaker, but you are losing the tissue that is the most "active" and able to burn calories, so you are more likely to regain weight when you increase your food intake again. You also are less able to use the glucose (sugar/starches/carbohydrates) that you eat, which can cause other health issues.   - Most men should eat AT LEAST 100 grams of protein per day, and most women should eat AT LEAST 80 grams  of protein per day. But this can change depending on kidney disease and other medical conditions, so please ask your doctor or dietician about your individual protein goal.     3. Exercise. Activating your muscles with exercise is important to signal to your body that it needs your muscle tissue and also tells your bones to stay strong. Research is very clear that exercise helps to prevent losing muscle when you are losing weight.   - both cardio and strength types of exercise help, but strength training may help more. We recommend that you try to do a combination of both types to get the most protection. This also helps your overall health, mobility, and energy levels!  - If you are not sure what you are able to do, please ask your health care team about what types  of exercise are safe for you to do.     ___________________________________________    Consider using a protein shake once a day (premier protein, pure protein, ensure protein max, fairlife; <250 kcal, <7g sugar, at least 25-30g protein)    Replace juice with fruit    Consider using senna + docusate (over the counter) for constipation
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
## 2023-01-13 ENCOUNTER — Encounter (INDEPENDENT_AMBULATORY_CARE_PROVIDER_SITE_OTHER): Payer: Self-pay

## 2023-01-15 ENCOUNTER — Encounter (INDEPENDENT_AMBULATORY_CARE_PROVIDER_SITE_OTHER): Payer: Self-pay

## 2023-01-22 ENCOUNTER — Telehealth (INDEPENDENT_AMBULATORY_CARE_PROVIDER_SITE_OTHER): Payer: Self-pay

## 2023-01-22 NOTE — Progress Notes (Signed)
Class conducted via [employer] app.     Nutrition 101    S & O: Pt attended and actively participated in group class.  Pt is interested in non-surgical weight loss to improve health.    Previous Wt:   Wt Readings from Last 10 Encounters:   01/08/23 194 lb   12/10/22 196 lb 6.4 oz   07/23/22 191 lb   07/17/22 185 lb   06/24/22 186 lb 12.8 oz   05/01/22 189 lb 9.6 oz   03/11/22 189 lb 9.6 oz   01/21/22 184 lb   06/29/21 180 lb   05/28/21 179 lb       Nutrition Assessment and Diagnosis: Pt with the condition of overweight or obesity (There is no height or weight on file to calculate BMI.) and co-morbidities with ongoing nutrition knowledge deficit as evidenced by initial report;  active participation.  Patient continues to demonstrate motivation to lose weight and change behaviors. Patient has mild nutrition knowledge deficit as evidenced per initial report and continued elevated BMI. Patient was educated during the group session on calories, nutrient density and how to pair appropriate foods together for meals.  Discussion focused on:     1. Behavior Modification for weight loss  2. Understanding differences between macro-nutrients: protein, fat, and carbohydrates.   3. Designing a nutritious, balanced meal at home and at restaurants. Meal and snack ideas provided.  3. Learning how to accurately measure portion sizes and track food intake.   4. How to achieve calorie deficit to result in weight loss.   5. Healthier substitutions for sugar-sweetened beverages.   6. The importance of meal timing, proper sleep, adequate hydration, and stress management  7. How 1:1 nutrition counseling can further support weight loss and maintenance efforts.  8. Personalized vs self-guided wellness programs our practice offers for support.     Materials Provided:  Copy of powerpoint slides.     P.   Pt will start goal setting and make small but measureable improvement in meal pattern, food choices, and lifestyle habits.      Educated pt  for 60 minutes in a group setting.  Plan reviewed with DO/NP.

## 2023-02-03 ENCOUNTER — Ambulatory Visit (INDEPENDENT_AMBULATORY_CARE_PROVIDER_SITE_OTHER): Payer: Medicare Other

## 2023-02-03 DIAGNOSIS — E785 Hyperlipidemia, unspecified: Secondary | ICD-10-CM

## 2023-02-03 DIAGNOSIS — I10 Essential (primary) hypertension: Secondary | ICD-10-CM

## 2023-02-07 ENCOUNTER — Ambulatory Visit (INDEPENDENT_AMBULATORY_CARE_PROVIDER_SITE_OTHER): Payer: Medicare Other | Admitting: Family Medicine

## 2023-02-07 ENCOUNTER — Encounter (INDEPENDENT_AMBULATORY_CARE_PROVIDER_SITE_OTHER): Payer: Self-pay | Admitting: Family Medicine

## 2023-02-07 VITALS — BP 123/77 | HR 91 | Temp 98.3°F | Ht 67.0 in | Wt 194.6 lb

## 2023-02-07 DIAGNOSIS — K581 Irritable bowel syndrome with constipation: Secondary | ICD-10-CM

## 2023-02-07 MED ORDER — LINZESS 72 MCG PO CAPS
72.0000 ug | ORAL_CAPSULE | Freq: Every morning | ORAL | 1 refills | Status: DC
Start: 2023-02-07 — End: 2023-05-12

## 2023-02-07 NOTE — Progress Notes (Signed)
Subjective:      Date: 02/07/2023 4:11 PM   Patient ID: Megan White is a 67 y.o. female.    Chief Complaint:  Chief Complaint   Patient presents with    Constipation     linzess       HPI:  HPI  Patient is a 67 year old female who comes into the clinic to discuss her IBS with constipation.    Patient has IBS with constipation and notes that she has tried all of the over-the-counter regimens for constipation without any improvement in her symptoms.  She has been following with a weight loss clinic and was prescribed medication that worsened her constipation.  She notes that in the past her GI doctor recommended Linzess and she notes that a friend is on Linzess for constipation and has good effects.  She wonders whether she should try same.  She notes in the past out-of-pocket cost was $100 per bottle and wonders if her current insurance may cover it better.      Problem List:  Patient Active Problem List   Diagnosis    Vitamin D deficiency    Obstructive sleep apnea hypopnea, moderate    Major depressive disorder    Kidney stone    Hypercholesterolemia    Herniated cervical disc    Primary insomnia    Chronic constipation    Benign essential hypertension    Allergic rhinitis due to pollen    Acid reflux    History of hypertension    Family history of colon cancer       Current Medications:  Current Outpatient Medications   Medication Sig Dispense Refill    buPROPion XL (Wellbutrin XL) 150 MG 24 hr tablet Take 1 tablet (150 mg) by mouth every morning 30 tablet 2    diphenhydrAMINE-acetaminophen (TYLENOL PM) 25-500 MG Tab Take 1 tablet by mouth nightly as needed      hydroCHLOROthiazide (HYDRODIURIL) 25 MG tablet Take 1 tablet (25 mg) by mouth daily 90 tablet 1    MELATONIN PO Take by mouth nightly as needed      omeprazole (PriLOSEC) 20 MG capsule Take 1 capsule (20 mg) by mouth daily as needed      polyethylene glycol (MIRALAX) 17 GM/SCOOP powder Take by mouth      pravastatin (PRAVACHOL) 40 MG tablet Take 1 tablet (40  mg) by mouth every evening 90 tablet 1    linaCLOtide (Linzess) 72 MCG capsule Take 1 capsule (72 mcg) by mouth every morning before breakfast 90 capsule 1     No current facility-administered medications for this visit.       Allergies:  Allergies   Allergen Reactions    Oxycodone Dizziness    Ace Inhibitors Other (See Comments) and Cough     Cough-LISINOPRIL  cough         Past Medical History:  Past Medical History:   Diagnosis Date    Anxiety     NO MED    Bilateral cataracts     BILAT- REMOVED    Depression     NO MED    Family history of colon cancer     PREOP DIAGNOSIS    Gastroesophageal reflux disease     OMEPRAZOLE PRN    Hyperlipidemia     Hypertension     Screen for colon cancer     PREOP DIAGNOSIS       ROS:  Review of Systems   Constitutional:  Negative for chills and fever.  Respiratory:  Negative for shortness of breath and wheezing.    Cardiovascular:  Negative for chest pain and palpitations.   Gastrointestinal:  Positive for constipation. Negative for abdominal pain, diarrhea, nausea and vomiting.   Musculoskeletal: Negative.    Skin:  Negative for rash.   Neurological:  Negative for dizziness, light-headedness and headaches.   Psychiatric/Behavioral: Negative.          Objective:     Vitals:  BP 123/77 (BP Site: Left arm, Patient Position: Sitting, Cuff Size: Medium)   Pulse 91   Temp 98.3 F (36.8 C) (Temporal)   Ht 1.702 m (5\' 7" )   Wt 88.3 kg (194 lb 9.6 oz)   BMI 30.48 kg/m     Physical Exam:  Physical Exam  Constitutional:       Appearance: Normal appearance. She is well-developed and normal weight. She is not ill-appearing.   HENT:      Head: Normocephalic and atraumatic.   Eyes:      Extraocular Movements: Extraocular movements intact.      Conjunctiva/sclera: Conjunctivae normal.      Pupils: Pupils are equal, round, and reactive to light.   Pulmonary:      Effort: Pulmonary effort is normal. No respiratory distress.   Musculoskeletal:      Cervical back: Normal range of motion.    Neurological:      General: No focal deficit present.      Mental Status: She is alert and oriented to person, place, and time.      Cranial Nerves: No cranial nerve deficit.   Psychiatric:         Mood and Affect: Mood normal.         Behavior: Behavior normal.         Thought Content: Thought content normal.         Judgment: Judgment normal.         Assessment/Plan:       1. Irritable bowel syndrome with constipation  -Acute  -Patient has as IBS with constipation and per patient report Linzess was considered by her GI doctor.  If there seems to be no contraindication and it was recommended by GI then reasonable to try Linzess for IBS with constipation.  Patient educated about same.  Patient educated about side effect and benefit profile of Linzess.  Recommend that she also consider establishing with GI in the area since she notes symptoms are significant.  Recommend that she also discuss all of her medical conditions with all of her specialists including her weight loss physician so that they keep this in mind when considering weight loss options.  - linaCLOtide (Linzess) 72 MCG capsule; Take 1 capsule (72 mcg) by mouth every morning before breakfast  Dispense: 90 capsule; Refill: 1  - Ambulatory referral to Weight Loss Services; Future  - Referral to Gastroenterology ([place]); Future      Return in about 6 months (around 08/09/2023) for Chronic disease management.    Earnestine Leys, MD

## 2023-02-07 NOTE — Progress Notes (Signed)
Have you seen any specialists since your last visit with [place]?  No      The patient was informed that the following HM items are still outstanding:   Health Maintenance Due   Topic Date Due    DXA Scan  Never done    Shingrix Vaccine 50+ (1) Never done    COVID-19 Vaccine (6 - 2023-24 season) 06/28/2022

## 2023-03-04 ENCOUNTER — Ambulatory Visit (INDEPENDENT_AMBULATORY_CARE_PROVIDER_SITE_OTHER): Payer: Medicare Other

## 2023-03-04 DIAGNOSIS — I10 Essential (primary) hypertension: Secondary | ICD-10-CM

## 2023-03-04 DIAGNOSIS — E785 Hyperlipidemia, unspecified: Secondary | ICD-10-CM

## 2023-04-03 ENCOUNTER — Ambulatory Visit (INDEPENDENT_AMBULATORY_CARE_PROVIDER_SITE_OTHER): Payer: Medicare Other

## 2023-04-03 DIAGNOSIS — E785 Hyperlipidemia, unspecified: Secondary | ICD-10-CM

## 2023-04-03 DIAGNOSIS — I10 Essential (primary) hypertension: Secondary | ICD-10-CM

## 2023-05-12 ENCOUNTER — Telehealth (INDEPENDENT_AMBULATORY_CARE_PROVIDER_SITE_OTHER): Payer: Medicare Other | Admitting: Family Medicine

## 2023-05-12 ENCOUNTER — Encounter (INDEPENDENT_AMBULATORY_CARE_PROVIDER_SITE_OTHER): Payer: Self-pay | Admitting: Family Medicine

## 2023-05-12 DIAGNOSIS — R7303 Prediabetes: Secondary | ICD-10-CM

## 2023-05-12 DIAGNOSIS — E7800 Pure hypercholesterolemia, unspecified: Secondary | ICD-10-CM

## 2023-05-12 DIAGNOSIS — I10 Essential (primary) hypertension: Secondary | ICD-10-CM

## 2023-05-12 DIAGNOSIS — K581 Irritable bowel syndrome with constipation: Secondary | ICD-10-CM

## 2023-05-12 DIAGNOSIS — G4733 Obstructive sleep apnea (adult) (pediatric): Secondary | ICD-10-CM

## 2023-05-12 MED ORDER — LINZESS 290 MCG PO CAPS
290.0000 ug | ORAL_CAPSULE | Freq: Every morning | ORAL | 1 refills | Status: DC
Start: 2023-05-12 — End: 2024-02-02

## 2023-05-12 MED ORDER — PRAVASTATIN SODIUM 40 MG PO TABS
40.0000 mg | ORAL_TABLET | Freq: Every evening | ORAL | 1 refills | Status: DC
Start: 2023-05-12 — End: 2024-06-17

## 2023-05-12 MED ORDER — HYDROCHLOROTHIAZIDE 25 MG PO TABS
25.0000 mg | ORAL_TABLET | Freq: Every day | ORAL | 1 refills | Status: DC
Start: 2023-05-12 — End: 2024-02-03

## 2023-05-12 NOTE — Progress Notes (Signed)
Subjective:      Date: 05/12/2023 10:27 AM   Patient ID: Megan White is a 67 y.o. female.    Chief Complaint:  Chief Complaint   Patient presents with    Constipation     Follow up on Le    Hypertension    Hyperlipidemia       HPI:  Constipation  Pertinent negatives include no abdominal pain, diarrhea, fever, nausea or vomiting.   Hypertension    Hyperlipidemia            Patient has hypertension for which she is on HCTZ 25 mg daily.  Patient has obstructive mild sleep apnea and not on CPAP but has been on same in the past but she could not tolerate). Patient does not check ambulatory blood pressures but has a machine and the proper US of the machine was review so patient could take a BP reading this visit. BP with arm cuff is 129/73.     BP Readings from Last 3 Encounters:   02/07/23 123/77   01/08/23 140/84   12/10/22 133/83     Patient has hyperlipidemia for which she is on pravastatin 40 mg daily and tolerating well without any noted side effects or myalgias. Had been doing yard work and gardening. Want to get back to walking.     Lab Results   Component Value Date    CHOL 205 (H) 12/18/2022    CHOL 197 03/12/2022    CHOL 215 (H) 07/03/2021     Lab Results   Component Value Date    HDL 58 12/18/2022    HDL 64 03/12/2022    HDL 66 07/03/2021     Lab Results   Component Value Date    LDL 129 (H) 12/18/2022    LDL 120 (H) 03/12/2022    LDL 133 (H) 07/03/2021     Lab Results   Component Value Date    TRIG 88 12/18/2022    TRIG 70 03/12/2022    TRIG 88 07/03/2021        Patient is a prediabetic but has never needed to be on medication for diabetes.    Lab Results   Component Value Date    HGBA1C 5.5 12/18/2022       Patient has IBS with constipation for which she takes Linzess 72 mcg daily but note that it did not help so she started taking 4 table daily which help significantly.         Problem List:  Patient Active Problem List   Diagnosis    Vitamin D deficiency    Obstructive sleep apnea hypopnea, moderate    Major  depressive disorder    Kidney stone    Hypercholesterolemia    Herniated cervical disc    Primary insomnia    Chronic constipation    Benign essential hypertension    Allergic rhinitis due to pollen    Acid reflux    History of hypertension    Family history of colon cancer       Current Medications:  Current Outpatient Medications   Medication Sig Dispense Refill    buPROPion XL (Wellbutrin XL) 150 MG 24 hr tablet Take 1 tablet (150 mg) by mouth every morning 30 tablet 2    diphenhydrAMINE-acetaminophen (TYLENOL PM) 25-500 MG Tab Take 1 tablet by mouth nightly as needed      hydroCHLOROthiazide (HYDRODIURIL) 25 MG tablet Take 1 tablet (25 mg) by mouth daily 90 tablet 1    linaCLOtide (  Linzess) 290 MCG capsule Take 1 capsule (290 mcg) by mouth every morning before breakfast 90 capsule 1    MELATONIN PO Take by mouth nightly as needed      omeprazole (PriLOSEC) 20 MG capsule Take 1 capsule (20 mg) by mouth daily as needed      polyethylene glycol (MIRALAX) 17 GM/SCOOP powder Take by mouth      pravastatin (PRAVACHOL) 40 MG tablet Take 1 tablet (40 mg) by mouth every evening 90 tablet 1     No current facility-administered medications for this visit.       Allergies:  Allergies   Allergen Reactions    Oxycodone Dizziness    Ace Inhibitors Other (See Comments) and Cough     Cough-LISINOPRIL  cough         Past Medical History:  Past Medical History:   Diagnosis Date    Anxiety     NO MED    Bilateral cataracts     BILAT- REMOVED    Depression     NO MED    Family history of colon cancer     PREOP DIAGNOSIS    Gastroesophageal reflux disease     OMEPRAZOLE PRN    Hyperlipidemia     Hypertension     Screen for colon cancer     PREOP DIAGNOSIS       ROS:  Review of Systems   Constitutional:  Negative for chills and fever.   Respiratory:  Negative for shortness of breath and wheezing.    Cardiovascular:  Negative for chest pain and palpitations.   Gastrointestinal:  Positive for constipation. Negative for abdominal pain,  diarrhea, nausea and vomiting.   Musculoskeletal: Negative.    Skin:  Negative for rash.   Neurological:  Negative for dizziness, light-headedness and headaches.   Psychiatric/Behavioral: Negative.          Objective:     Vitals:  There were no vitals taken for this visit.    Physical Exam:  Physical Exam  Constitutional:       Appearance: Normal appearance. She is well-developed and normal weight. She is not ill-appearing.   HENT:      Head: Normocephalic and atraumatic.   Eyes:      Extraocular Movements: Extraocular movements intact.      Conjunctiva/sclera: Conjunctivae normal.      Pupils: Pupils are equal, round, and reactive to light.   Pulmonary:      Effort: Pulmonary effort is normal. No respiratory distress.   Musculoskeletal:      Cervical back: Normal range of motion.   Neurological:      General: No focal deficit present.      Mental Status: She is alert and oriented to person, place, and time.      Cranial Nerves: No cranial nerve deficit.   Psychiatric:         Mood and Affect: Mood normal.         Behavior: Behavior normal.         Thought Content: Thought content normal.         Judgment: Judgment normal.         Assessment/Plan:       1. Benign essential hypertension  - Stable  - Ambulatory blood pressures is at goal.    - Continue to optimize low salt diet and aerobic exercise efforts. and Continue current medication regimen.   -Recommend optimizing therapeutic lifestyle changes which include obtaining at least 150 minutes of  aerobic exercise per week and eating a heart healthy diet ( i.e. DASH diet - www.heart.org or PopSteam.is ). Recommend out-of-office blood pressure measurements for titration of BP-lowering medication. Recommend checking ambulatory blood pressures once or twice a day and document in a log.  Bring the log and blood pressure cuff into the office at the next follow-up visit or utilize MyChart to communicate BP readings every 2-4 weeks.  - hydroCHLOROthiazide (HYDRODIURIL)  25 MG tablet; Take 1 tablet (25 mg) by mouth daily  Dispense: 90 tablet; Refill: 1  - Comprehensive Metabolic Panel; Future    2. OSA (obstructive sleep apnea)  -Acute  -Patient continues to have apnea spells at night.  Patient referred to sleep specialist for further evaluation and management of same.  - Ambulatory referral to Sleep Medicine ([place]); Future    3. Hypercholesterolemia  - Chronic  -Tolerating pravastatin well so we will continue same.  Patient due for lipid check in the improvement on her current medication.  She plans to continue to work on lifestyle modification to further improve her numbers.  -To help improve your cholesterol recommend aerobic exercising such as power walking, jogging, elliptical or exercise bike/cycling.  To improve your cholesterol with diet changes recommend avoiding cooking with oils containing complex fats such as coconut oil, margarine and butter.  Olive oil is to healthy as if you are going to cook with oils.  Recommend avoiding marbled meats such as steak and ribs which have stranding of fats through them.  Chicken without the skin [place] without the skin and fish orderliness.  Recommend choosing lean ground meats.  Avoid fast food and fried foods.  - pravastatin (PRAVACHOL) 40 MG tablet; Take 1 tablet (40 mg) by mouth every evening  Dispense: 90 tablet; Refill: 1  - Lipid Panel; Future    4. Prediabetes  -Chronic  -Recommend working on low-carb diet and regular exercise to prevent progression to diabetes.  - Hemoglobin A1C; Future    5. Irritable bowel syndrome with constipation  - Acute  -Patient notes some improvement on the higher dose of Linzess so we will do a prescription for 290 mcg daily.  Patient educated that this medication should not be used multiple times a day and is meant for once a day use.  She reports that she has been taking 4 tablets of her previous prescription and tolerating well with improvement in constipation.  - linaCLOtide (Linzess) 290 MCG  capsule; Take 1 capsule (290 mcg) by mouth every morning before breakfast  Dispense: 90 capsule; Refill: 1      Return in about 6 months (around 11/12/2023) for Medicare Wellness Visit.    Earnestine Leys, MD

## 2023-05-12 NOTE — Patient Instructions (Addendum)
Please call [phone] to schedule your sleep medicine referral      [facility]   [address]   [place] [address]   Phone: [phone]   Fax: [phone]

## 2023-05-14 ENCOUNTER — Ambulatory Visit (INDEPENDENT_AMBULATORY_CARE_PROVIDER_SITE_OTHER): Payer: Medicare Other

## 2023-05-14 DIAGNOSIS — I10 Essential (primary) hypertension: Secondary | ICD-10-CM

## 2023-05-14 DIAGNOSIS — E785 Hyperlipidemia, unspecified: Secondary | ICD-10-CM

## 2023-05-20 ENCOUNTER — Other Ambulatory Visit: Payer: Medicare Other

## 2023-05-27 ENCOUNTER — Other Ambulatory Visit (FREE_STANDING_LABORATORY_FACILITY): Payer: Medicare Other

## 2023-05-27 DIAGNOSIS — R7303 Prediabetes: Secondary | ICD-10-CM

## 2023-05-27 DIAGNOSIS — E7800 Pure hypercholesterolemia, unspecified: Secondary | ICD-10-CM

## 2023-05-27 DIAGNOSIS — I10 Essential (primary) hypertension: Secondary | ICD-10-CM

## 2023-05-27 LAB — COMPREHENSIVE METABOLIC PANEL
ALT: 22 U/L (ref 0–55)
AST (SGOT): 31 U/L (ref 5–41)
Albumin/Globulin Ratio: 1.2 (ref 0.9–2.2)
Albumin: 4.2 g/dL (ref 3.5–5.0)
Alkaline Phosphatase: 35 U/L — ABNORMAL LOW (ref 37–117)
Anion Gap: 11 (ref 5.0–15.0)
BUN: 15 mg/dL (ref 7–21)
Bilirubin, Total: 0.3 mg/dL (ref 0.2–1.2)
CO2: 27 mEq/L (ref 17–29)
Calcium: 9.9 mg/dL (ref 8.5–10.5)
Chloride: 101 mEq/L (ref 99–111)
Creatinine: 1 mg/dL (ref 0.4–1.0)
GFR: >60 mL/min/{1.73_m2} (ref >=60.0)
Globulin: 3.4 g/dL (ref 2.0–3.6)
Glucose: 97 mg/dL (ref 70–100)
Hemolysis Index: 28 Index
Potassium: 4.6 mEq/L (ref 3.5–5.3)
Protein, Total: 7.6 g/dL (ref 6.0–8.3)
Sodium: 139 mEq/L (ref 135–145)

## 2023-05-27 LAB — LIPID PANEL
Cholesterol / HDL Ratio: 3.5 Index
Cholesterol: 205 mg/dL — ABNORMAL HIGH (ref <=199)
HDL: 59 mg/dL (ref >=40)
LDL Calculated: 130 mg/dL — ABNORMAL HIGH (ref 0–129)
Triglycerides: 82 mg/dL (ref 34–149)
VLDL Calculated: 16 mg/dL (ref 10–40)

## 2023-05-27 LAB — HEMOGLOBIN A1C
Average Estimated Glucose: 111.2 mg/dL
Hemoglobin A1C: 5.5 % (ref 4.6–5.6)

## 2023-05-29 NOTE — Progress Notes (Signed)
Comprehensive Metabolic Panel (CMP) which includes liver function, kidney function, electrolytes, and glucose is within acceptable limits.   A1C (3 month avg sugar screening for diabetes) is within the normal range.   Total and bad (LDL) cholesterol are higher than the normal range.  On your current dose of pravastatin.  If you can significantly get back to exercise such as walking and be more consistent with low-fat diet then recommend doing so before we consider further increasing your dose of pravastatin or adding another cholesterol medication with it to help improve your cholesterol.  Recommend working on healthy low fat diet and exercise to help improve your cholesterol and lower your risk of cardiovascular disease (strokes and heart attack).  Recommend omega-3 fatty acid supplements, a diet that is high in nuts, soy based products, lean meats (such as poultry and fish) and green teas.  Avoid cooking with oils (olive oil is the healthiest of the oils), margarine or butter.  Avoid fast food or fried foods.    Follow up appointment in 6 months for Medicare annual wellness or sooner for any new concerns.

## 2023-05-30 ENCOUNTER — Ambulatory Visit
Admission: RE | Admit: 2023-05-30 | Discharge: 2023-05-30 | Disposition: A | Discharge status: Home / Self Care | Payer: Medicare Other | Source: Ambulatory Visit | Attending: Physician Assistant | Admitting: Physician Assistant

## 2023-05-30 ENCOUNTER — Ambulatory Visit: Payer: Medicare Other | Attending: Family Medicine | Admitting: Physician Assistant

## 2023-05-30 ENCOUNTER — Encounter: Payer: Self-pay | Admitting: Physician Assistant

## 2023-05-30 VITALS — BP 136/86 | HR 86 | Ht 68.0 in | Wt 193.0 lb

## 2023-05-30 DIAGNOSIS — M25551 Pain in right hip: Secondary | ICD-10-CM | POA: Insufficient documentation

## 2023-05-30 DIAGNOSIS — M5441 Lumbago with sciatica, right side: Secondary | ICD-10-CM | POA: Insufficient documentation

## 2023-05-30 DIAGNOSIS — Z9889 Other specified postprocedural states: Secondary | ICD-10-CM | POA: Insufficient documentation

## 2023-05-30 DIAGNOSIS — M25561 Pain in right knee: Secondary | ICD-10-CM

## 2023-05-30 DIAGNOSIS — G8929 Other chronic pain: Secondary | ICD-10-CM | POA: Insufficient documentation

## 2023-05-30 DIAGNOSIS — M545 Low back pain, unspecified: Secondary | ICD-10-CM | POA: Insufficient documentation
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
## 2023-06-11 ENCOUNTER — Ambulatory Visit (INDEPENDENT_AMBULATORY_CARE_PROVIDER_SITE_OTHER): Payer: Medicare Other

## 2023-06-11 DIAGNOSIS — I10 Essential (primary) hypertension: Secondary | ICD-10-CM

## 2023-06-11 DIAGNOSIS — E785 Hyperlipidemia, unspecified: Secondary | ICD-10-CM

## 2023-07-07 ENCOUNTER — Ambulatory Visit: Payer: Medicare Other | Attending: Physician Assistant | Admitting: Physician Assistant

## 2023-07-07 ENCOUNTER — Encounter: Payer: Self-pay | Admitting: Physician Assistant

## 2023-07-07 DIAGNOSIS — G8929 Other chronic pain: Secondary | ICD-10-CM

## 2023-07-07 DIAGNOSIS — M545 Low back pain, unspecified: Secondary | ICD-10-CM

## 2023-07-07 MED ORDER — DICLOFENAC SODIUM 75 MG PO TBEC
75.0000 mg | DELAYED_RELEASE_TABLET | Freq: Two times a day (BID) | ORAL | 0 refills | Status: DC
Start: 2023-07-07 — End: 2023-08-11
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
## 2023-07-28 ENCOUNTER — Ambulatory Visit (INDEPENDENT_AMBULATORY_CARE_PROVIDER_SITE_OTHER): Payer: Self-pay

## 2023-07-28 DIAGNOSIS — I10 Essential (primary) hypertension: Secondary | ICD-10-CM

## 2023-07-28 DIAGNOSIS — E785 Hyperlipidemia, unspecified: Secondary | ICD-10-CM

## 2023-08-04 ENCOUNTER — Ambulatory Visit (INDEPENDENT_AMBULATORY_CARE_PROVIDER_SITE_OTHER): Payer: Medicare Other

## 2023-08-04 DIAGNOSIS — I10 Essential (primary) hypertension: Secondary | ICD-10-CM

## 2023-08-04 DIAGNOSIS — E785 Hyperlipidemia, unspecified: Secondary | ICD-10-CM

## 2023-08-09 ENCOUNTER — Other Ambulatory Visit: Payer: Self-pay | Admitting: Physician Assistant

## 2023-08-29 NOTE — Progress Notes (Signed)
 [facility] PULMONARY SLEEP CONSULT    Patient Name: Megan White, Megan White  Date of Visit:  09/01/2023   Date of Birth: May 26, 1956  AGE: 67 y.o.  Medical Record #: [id number]  Requesting Physician: Earnestine Leys, MD         DIAGNOSIS:     1. OSA (obstructive sleep apnea)

## 2023-09-01 ENCOUNTER — Ambulatory Visit (INDEPENDENT_AMBULATORY_CARE_PROVIDER_SITE_OTHER): Payer: Medicare Other | Admitting: Internal Medicine

## 2023-09-01 ENCOUNTER — Encounter (INDEPENDENT_AMBULATORY_CARE_PROVIDER_SITE_OTHER): Payer: Self-pay | Admitting: Internal Medicine

## 2023-09-01 VITALS — BP 131/86 | HR 88 | Temp 97.6°F | Ht 66.6 in | Wt 184.0 lb

## 2023-09-01 DIAGNOSIS — F5101 Primary insomnia: Secondary | ICD-10-CM

## 2023-09-01 DIAGNOSIS — R351 Nocturia: Secondary | ICD-10-CM

## 2023-09-01 DIAGNOSIS — I10 Essential (primary) hypertension: Secondary | ICD-10-CM

## 2023-09-01 DIAGNOSIS — G4733 Obstructive sleep apnea (adult) (pediatric): Secondary | ICD-10-CM

## 2023-09-01 DIAGNOSIS — R519 Headache, unspecified: Secondary | ICD-10-CM

## 2023-09-01 DIAGNOSIS — R400 Somnolence: Secondary | ICD-10-CM

## 2023-09-01 NOTE — Patient Instructions (Signed)
 Please call [phone] to schedule your sleep study  Results take 7-14 days after the test has been completed  Please ensure you have a follow up appointment with your ordering physician to review the study

## 2023-09-02 ENCOUNTER — Ambulatory Visit (INDEPENDENT_AMBULATORY_CARE_PROVIDER_SITE_OTHER): Payer: Medicare Other

## 2023-09-02 DIAGNOSIS — E785 Hyperlipidemia, unspecified: Secondary | ICD-10-CM

## 2023-09-02 DIAGNOSIS — I10 Essential (primary) hypertension: Secondary | ICD-10-CM

## 2023-09-03 ENCOUNTER — Encounter (INDEPENDENT_AMBULATORY_CARE_PROVIDER_SITE_OTHER): Payer: Self-pay | Admitting: Internal Medicine

## 2023-09-04 ENCOUNTER — Other Ambulatory Visit (INDEPENDENT_AMBULATORY_CARE_PROVIDER_SITE_OTHER): Payer: Self-pay | Admitting: Family Medicine

## 2023-09-04 DIAGNOSIS — K581 Irritable bowel syndrome with constipation: Secondary | ICD-10-CM

## 2023-09-04 DIAGNOSIS — I10 Essential (primary) hypertension: Secondary | ICD-10-CM

## 2023-09-07 NOTE — Progress Notes (Signed)
 [address], [address]  [place], [place] [address]  Phone:[phone]  Fax:[phone]      NEW PATIENT VISIT    Date of Visit: 09/08/2023 12:24 PM  Patient ID: Megan White is a 67 y.o. female.  PCP: Earnestine Leys, MD        Referring Physici

## 2023-09-07 NOTE — Patient Instructions (Addendum)
 I am glad to see you today to address your concerns: Abd pain, IBS, history of colon polyp and family history of colon cancer       Please follow the instructions and treatment as discussed:       Please follow up with your PCP for back pain/problem causin

## 2023-09-08 ENCOUNTER — Ambulatory Visit (INDEPENDENT_AMBULATORY_CARE_PROVIDER_SITE_OTHER): Payer: Medicare Other | Admitting: Gastroenterology

## 2023-09-08 ENCOUNTER — Encounter (INDEPENDENT_AMBULATORY_CARE_PROVIDER_SITE_OTHER): Payer: Self-pay | Admitting: Gastroenterology

## 2023-09-08 VITALS — BP 129/79 | HR 81 | Temp 98.8°F | Resp 18 | Ht 66.5 in | Wt 183.0 lb

## 2023-09-08 DIAGNOSIS — K319 Disease of stomach and duodenum, unspecified: Secondary | ICD-10-CM

## 2023-09-08 DIAGNOSIS — R109 Unspecified abdominal pain: Secondary | ICD-10-CM

## 2023-09-08 DIAGNOSIS — K219 Gastro-esophageal reflux disease without esophagitis: Secondary | ICD-10-CM

## 2023-09-08 DIAGNOSIS — K5909 Other constipation: Secondary | ICD-10-CM

## 2023-09-08 DIAGNOSIS — Z800 Family history of malignant neoplasm of digestive organs: Secondary | ICD-10-CM

## 2023-09-08 DIAGNOSIS — K589 Irritable bowel syndrome without diarrhea: Secondary | ICD-10-CM

## 2023-09-08 DIAGNOSIS — R140 Abdominal distension (gaseous): Secondary | ICD-10-CM

## 2023-09-08 DIAGNOSIS — Z860100 Personal history of colon polyps, unspecified: Secondary | ICD-10-CM

## 2023-09-10 ENCOUNTER — Telehealth (INDEPENDENT_AMBULATORY_CARE_PROVIDER_SITE_OTHER): Payer: Self-pay

## 2023-09-10 NOTE — Telephone Encounter (Signed)
Called to inform patient that Dr.Cho added some blood and stool labs and abd/pelvic CT so please call pt to have them done soon and follow up with her PCP/Neurology as well as discussed. Thank you!

## 2023-10-03 ENCOUNTER — Ambulatory Visit (INDEPENDENT_AMBULATORY_CARE_PROVIDER_SITE_OTHER): Payer: Medicare Other

## 2023-10-03 DIAGNOSIS — E785 Hyperlipidemia, unspecified: Secondary | ICD-10-CM

## 2023-10-03 DIAGNOSIS — I10 Essential (primary) hypertension: Secondary | ICD-10-CM

## 2023-10-24 ENCOUNTER — Ambulatory Visit: Payer: Medicare Other | Attending: Internal Medicine

## 2023-10-24 DIAGNOSIS — I10 Essential (primary) hypertension: Secondary | ICD-10-CM | POA: Insufficient documentation

## 2023-10-24 DIAGNOSIS — G4733 Obstructive sleep apnea (adult) (pediatric): Secondary | ICD-10-CM | POA: Insufficient documentation

## 2023-10-24 DIAGNOSIS — F5101 Primary insomnia: Secondary | ICD-10-CM | POA: Insufficient documentation

## 2023-10-24 DIAGNOSIS — R351 Nocturia: Secondary | ICD-10-CM | POA: Insufficient documentation

## 2023-10-25 NOTE — Progress Notes (Signed)
 Patient: Megan White  MRN: [id number]  Date: 10/24/23    [name] is a 67 year old female scheduled for SPLIT sleep study.  Referring physician's order reviewed. Patient was oriented to the sleep lab environment and emergency fire exit route. Individualized s

## 2023-10-27 ENCOUNTER — Other Ambulatory Visit (FREE_STANDING_LABORATORY_FACILITY): Payer: Medicare Other

## 2023-10-27 ENCOUNTER — Encounter (INDEPENDENT_AMBULATORY_CARE_PROVIDER_SITE_OTHER): Payer: Self-pay | Admitting: Internal Medicine

## 2023-10-27 DIAGNOSIS — K219 Gastro-esophageal reflux disease without esophagitis: Secondary | ICD-10-CM

## 2023-10-27 DIAGNOSIS — R140 Abdominal distension (gaseous): Secondary | ICD-10-CM

## 2023-10-27 DIAGNOSIS — K5909 Other constipation: Secondary | ICD-10-CM

## 2023-10-27 DIAGNOSIS — R109 Unspecified abdominal pain: Secondary | ICD-10-CM

## 2023-10-27 DIAGNOSIS — K319 Disease of stomach and duodenum, unspecified: Secondary | ICD-10-CM

## 2023-10-27 LAB — LAB USE ONLY - CBC WITH DIFFERENTIAL
Absolute Basophils: 0.03 10*3/uL (ref 0.00–0.08)
Absolute Eosinophils: 0.06 10*3/uL (ref 0.00–0.44)
Absolute Immature Granulocytes: 0.01 10*3/uL (ref 0.00–0.07)
Absolute Lymphocytes: 3.31 10*3/uL — ABNORMAL HIGH (ref 0.42–3.22)
Absolute Monocytes: 0.58 10*3/uL (ref 0.21–0.85)
Absolute Neutrophils: 1.47 10*3/uL (ref 1.10–6.33)
Absolute nRBC: 0 10*3/uL (ref <=0.00)
Basophils %: 0.5 %
Eosinophils %: 1.1 %
Hematocrit: 38.7 % (ref 34.7–43.7)
Hemoglobin: 11.7 g/dL (ref 11.4–14.8)
Immature Granulocytes %: 0.2 %
Lymphocytes %: 60.6 %
MCH: 26.1 pg (ref 25.1–33.5)
MCHC: 30.2 g/dL — ABNORMAL LOW (ref 31.5–35.8)
MCV: 86.4 fL (ref 78.0–96.0)
MPV: 8.9 fL (ref 8.9–12.5)
Monocytes %: 10.6 %
Neutrophils %: 27 %
Platelet Count: 250 10*3/uL (ref 142–346)
Preliminary Absolute Neutrophil Count: 1.47 10*3/uL (ref 1.10–6.33)
RBC: 4.48 10*6/uL (ref 3.90–5.10)
RDW: 13 % (ref 11–15)
WBC: 5.46 10*3/uL (ref 3.10–9.50)
nRBC %: 0 /100{WBCs} (ref <=0.0)

## 2023-10-27 LAB — COMPREHENSIVE METABOLIC PANEL
ALT: 25 U/L (ref <=55)
AST (SGOT): 27 U/L (ref <=41)
Albumin/Globulin Ratio: 1.1 (ref 0.9–2.2)
Albumin: 3.9 g/dL (ref 3.5–5.0)
Alkaline Phosphatase: 39 U/L (ref 37–117)
Anion Gap: 7 (ref 5.0–15.0)
BUN: 20 mg/dL (ref 7–21)
Bilirubin, Total: 0.5 mg/dL (ref 0.2–1.2)
CO2: 29 meq/L (ref 17–29)
Calcium: 9.4 mg/dL (ref 8.5–10.5)
Chloride: 105 meq/L (ref 99–111)
Creatinine: 0.9 mg/dL (ref 0.4–1.0)
GFR: >60 mL/min/{1.73_m2} (ref >=60.0)
Globulin: 3.4 g/dL (ref 2.0–3.6)
Glucose: 79 mg/dL (ref 70–100)
Hemolysis Index: 7 {index}
Potassium: 4.3 meq/L (ref 3.5–5.3)
Protein, Total: 7.3 g/dL (ref 6.0–8.3)
Sodium: 141 meq/L (ref 135–145)

## 2023-10-27 LAB — IRON PROFILE
Iron Saturation: 48 % (ref 15–50)
Iron: 143 ug/dL (ref 32–157)
TIBC: 296 ug/dL (ref 265–497)
UIBC: 153 ug/dL (ref 126–382)

## 2023-10-27 LAB — LIPASE: Lipase: 17 U/L (ref 8–78)

## 2023-10-27 LAB — C-REACTIVE PROTEIN: C-Reactive Protein: 0.1 mg/dL (ref 0.0–1.1)

## 2023-10-27 LAB — FERRITIN: Ferritin: 108 ng/mL (ref 4.60–204.00)

## 2023-10-27 LAB — SEDIMENTATION RATE: Sed Rate: 25 mm/h — ABNORMAL HIGH (ref <=20)

## 2023-10-27 NOTE — Addendum Note (Signed)
 Addended by: Royetta Crochet on: 10/27/2023 06:35 PM     Modules accepted: Orders

## 2023-10-27 NOTE — Procedures (Signed)
 SPLIT-NIGHT INTERPRETATION REPORT    INTERPRETATION by:  Carney Living, MD  American Board of Internal Medicine (ABIM) certified in Sleep Medicine, Pulmonary Medicine, and Critical Care Medicine  Office phone:  [phone]    REFER TO DATA REPORT (SCANNED I

## 2023-10-28 ENCOUNTER — Other Ambulatory Visit: Payer: Medicare Other

## 2023-10-28 LAB — STOOL OCCULT BLOOD, FECAL IMMUMOCHEMICAL TEST (FIT): Stool Occult Blood, Fecal Immunochemical Test (FIT): NEGATIVE

## 2023-10-30 ENCOUNTER — Encounter (INDEPENDENT_AMBULATORY_CARE_PROVIDER_SITE_OTHER): Payer: Self-pay | Admitting: Internal Medicine

## 2023-10-30 ENCOUNTER — Telehealth (INDEPENDENT_AMBULATORY_CARE_PROVIDER_SITE_OTHER): Payer: Self-pay | Admitting: Internal Medicine

## 2023-10-30 LAB — CELIAC DISEASE COMPREHENSIVE PANEL: IGA for Celiac: 318 mg/dL (ref 61–356)

## 2023-10-30 LAB — STOOL CALPROTECTIN: Calprotectin, Stool: <16.1 mg/kg (ref <50.0)

## 2023-10-30 LAB — TISSUE TRANSGLUTAMINASE IGA, REFLEX: Tissue Transglutaminase Antibody, IgA: 1.8 U/mL

## 2023-10-30 NOTE — Telephone Encounter (Signed)
Left message for Pt informing her order, ov note 09/01/23, sleep study 10/24/23 & demo's To [facility] [phone] and when machine arrives to call us to schedule sleep f/u between 31-90 days of usage

## 2023-11-01 LAB — STOOL HELICOBACTER PYLORI ANTIGEN: Stool H. pylori Antigen: NOT DETECTED

## 2023-11-03 ENCOUNTER — Encounter (INDEPENDENT_AMBULATORY_CARE_PROVIDER_SITE_OTHER): Payer: Self-pay

## 2023-11-04 ENCOUNTER — Encounter (INDEPENDENT_AMBULATORY_CARE_PROVIDER_SITE_OTHER): Payer: Self-pay

## 2023-11-07 ENCOUNTER — Other Ambulatory Visit (INDEPENDENT_AMBULATORY_CARE_PROVIDER_SITE_OTHER): Payer: Self-pay | Admitting: Gastroenterology

## 2023-11-12 ENCOUNTER — Encounter (INDEPENDENT_AMBULATORY_CARE_PROVIDER_SITE_OTHER): Payer: Self-pay | Admitting: Gastroenterology

## 2023-11-17 NOTE — Progress Notes (Signed)
 Alto Roxy RAMAN, MD: Please see MyChart comment below for details.      Kay/Amalia/Keisha:  Please call pt and review the results/recommendations.  Thank you.      Dear Ms. Stickley:    Your recent abdominal CT test(s) showed no significant abnormality of clinical importance except fibroid in the uterus thus recommend to follow up with PCP and GYN to further evaluate and better manage your condition/disease.       In addition, please follow any instructions/treatments as instructed and follow up with your primary physician or call us  or come to GI Clinic if your GI symptoms do not improve with treatment.      If you have any question, please call us  back at [phone].      Sincerely,        Norine Onetha Picking, MD

## 2023-11-18 ENCOUNTER — Encounter (INDEPENDENT_AMBULATORY_CARE_PROVIDER_SITE_OTHER): Payer: Self-pay

## 2023-11-19 ENCOUNTER — Ambulatory Visit (INDEPENDENT_AMBULATORY_CARE_PROVIDER_SITE_OTHER): Payer: Medicare Other

## 2023-11-19 DIAGNOSIS — E785 Hyperlipidemia, unspecified: Secondary | ICD-10-CM

## 2023-11-19 DIAGNOSIS — I10 Essential (primary) hypertension: Secondary | ICD-10-CM

## 2023-11-25 ENCOUNTER — Ambulatory Visit (INDEPENDENT_AMBULATORY_CARE_PROVIDER_SITE_OTHER): Payer: Medicare Other | Admitting: Physician Assistant

## 2023-11-25 ENCOUNTER — Encounter (INDEPENDENT_AMBULATORY_CARE_PROVIDER_SITE_OTHER): Payer: Self-pay | Admitting: Physician Assistant

## 2023-11-25 VITALS — BP 128/77 | HR 68

## 2023-11-25 DIAGNOSIS — M545 Low back pain, unspecified: Secondary | ICD-10-CM

## 2023-11-25 DIAGNOSIS — G8929 Other chronic pain: Secondary | ICD-10-CM

## 2023-11-25 DIAGNOSIS — K581 Irritable bowel syndrome with constipation: Secondary | ICD-10-CM

## 2023-11-25 DIAGNOSIS — K5909 Other constipation: Secondary | ICD-10-CM
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
## 2023-11-25 NOTE — Patient Instructions (Addendum)
 Would follow up with PCP as pain does not appear to GI related, Establish care with gynecology in regards to uterine fibroid   Repeat colonoscopy due 06/2027 unless o/w clinically indicated   Chronic constipation   Increase daily dietary fiber, increase intake of fruits and vegetables. 10 prunes or 2 kiwi fruit daily.  Apples, pears, dragon fruit, papaya also may help with constipation.  Increase hydration and drink lots of water (recommend at least least 64 ounces of water per day)  Avoid processed foods, limit consumption of red meat, etc.   Routine exercise   Trial Citrucel 1 tbsp   Simethicone  or GasX as needed   F/u w/ GI as needed

## 2023-12-10 ENCOUNTER — Ambulatory Visit (INDEPENDENT_AMBULATORY_CARE_PROVIDER_SITE_OTHER): Payer: Medicare Other | Admitting: Family

## 2023-12-15 ENCOUNTER — Ambulatory Visit (INDEPENDENT_AMBULATORY_CARE_PROVIDER_SITE_OTHER): Payer: Self-pay

## 2023-12-15 ENCOUNTER — Encounter (INDEPENDENT_AMBULATORY_CARE_PROVIDER_SITE_OTHER): Payer: Self-pay | Admitting: Family

## 2023-12-15 ENCOUNTER — Ambulatory Visit (INDEPENDENT_AMBULATORY_CARE_PROVIDER_SITE_OTHER): Payer: Medicare Other | Admitting: Family

## 2023-12-15 VITALS — BP 122/80 | HR 104 | Temp 98.0°F | Ht 66.5 in | Wt 187.0 lb

## 2023-12-15 DIAGNOSIS — R140 Abdominal distension (gaseous): Secondary | ICD-10-CM

## 2023-12-15 DIAGNOSIS — I10 Essential (primary) hypertension: Secondary | ICD-10-CM

## 2023-12-15 DIAGNOSIS — M25552 Pain in left hip: Secondary | ICD-10-CM

## 2023-12-15 DIAGNOSIS — K219 Gastro-esophageal reflux disease without esophagitis: Secondary | ICD-10-CM

## 2023-12-15 DIAGNOSIS — G8929 Other chronic pain: Secondary | ICD-10-CM

## 2023-12-15 DIAGNOSIS — E785 Hyperlipidemia, unspecified: Secondary | ICD-10-CM

## 2023-12-15 DIAGNOSIS — M1611 Unilateral primary osteoarthritis, right hip: Secondary | ICD-10-CM | POA: Insufficient documentation

## 2023-12-15 DIAGNOSIS — D259 Leiomyoma of uterus, unspecified: Secondary | ICD-10-CM

## 2023-12-15 MED ORDER — SIMETHICONE 80 MG PO CHEW
CHEWABLE_TABLET | ORAL | 2 refills | Status: AC
Start: 2023-12-15 — End: ?

## 2023-12-15 NOTE — Progress Notes (Signed)
 Have you seen any specialists since your last visit with us ?  Yes   GI      The patient was informed that the following HM items are still outstanding:   Health Maintenance Due   Topic Date Due    DXA Scan  Never done    Shingrix Vaccine 50+ (1) Never done    INFLUENZA VACCINE  05/29/2023    COVID-19 Vaccine (6 - 2024-25 season) 06/29/2023    Medicare Annual Wellness Visit  12/11/2023
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
## 2024-01-22 ENCOUNTER — Ambulatory Visit (INDEPENDENT_AMBULATORY_CARE_PROVIDER_SITE_OTHER): Payer: Self-pay

## 2024-01-22 DIAGNOSIS — I10 Essential (primary) hypertension: Secondary | ICD-10-CM

## 2024-01-22 DIAGNOSIS — E785 Hyperlipidemia, unspecified: Secondary | ICD-10-CM

## 2024-01-28 ENCOUNTER — Other Ambulatory Visit: Payer: Self-pay | Admitting: Family Medicine

## 2024-01-28 NOTE — Telephone Encounter (Signed)
 Copied from CRM [id number]. Topic: Clinical Support - Prescription Refill  >> Jan 28, 2024 11:43 AM Joetta BROCKS wrote:  LAURETHA MANOR called about Clinical Support - Prescription Refill.  Additional details:    Name, strength, directions of requested refill(s):    linaCLOtide  (Linzess ) 290 MCG capsule      How much medication is remaining: a few days     Pharmacy to send refill to or patient to pick up rx from office (mark requested pharmacy in BOLD):    @PREFPHARMACY @  [id number] - [place] - [address] AT [facility] [address]    Please mark X next to the preferred call back number:    Mobile: [phone] (mobile)   Home: [phone] (home)   Work: @WORKPHONE @       Medication refill request, see above. Thank you   Patient has been informed that medication refill requests should be called in up to one week prior to running out of medication.    Additional Notes:    Next Visit: MM/DD/YYYY

## 2024-02-02 ENCOUNTER — Other Ambulatory Visit (INDEPENDENT_AMBULATORY_CARE_PROVIDER_SITE_OTHER): Payer: Self-pay | Admitting: Family

## 2024-02-02 ENCOUNTER — Ambulatory Visit (INDEPENDENT_AMBULATORY_CARE_PROVIDER_SITE_OTHER): Payer: Self-pay

## 2024-02-02 DIAGNOSIS — K581 Irritable bowel syndrome with constipation: Secondary | ICD-10-CM

## 2024-02-02 DIAGNOSIS — E785 Hyperlipidemia, unspecified: Secondary | ICD-10-CM

## 2024-02-02 DIAGNOSIS — I10 Essential (primary) hypertension: Secondary | ICD-10-CM

## 2024-02-02 MED ORDER — LINZESS 290 MCG PO CAPS
290.0000 ug | ORAL_CAPSULE | Freq: Every morning | ORAL | 1 refills | Status: DC
Start: 2024-02-02 — End: 2024-06-23

## 2024-02-03 ENCOUNTER — Other Ambulatory Visit (INDEPENDENT_AMBULATORY_CARE_PROVIDER_SITE_OTHER): Payer: Self-pay | Admitting: Family Medicine

## 2024-02-03 DIAGNOSIS — I10 Essential (primary) hypertension: Secondary | ICD-10-CM

## 2024-02-03 MED ORDER — HYDROCHLOROTHIAZIDE 25 MG PO TABS
25.0000 mg | ORAL_TABLET | Freq: Every day | ORAL | 0 refills | Status: AC
Start: 2024-02-03 — End: ?

## 2024-03-01 ENCOUNTER — Ambulatory Visit (INDEPENDENT_AMBULATORY_CARE_PROVIDER_SITE_OTHER): Payer: Self-pay

## 2024-03-02 ENCOUNTER — Encounter (INDEPENDENT_AMBULATORY_CARE_PROVIDER_SITE_OTHER): Admitting: Family

## 2024-03-23 ENCOUNTER — Telehealth (HOSPITAL_BASED_OUTPATIENT_CLINIC_OR_DEPARTMENT_OTHER): Payer: Self-pay

## 2024-03-23 NOTE — Telephone Encounter (Signed)
 Received online request for consultation.  Reached out to patient for scheduling.  Left voice message and call back number.

## 2024-03-29 ENCOUNTER — Other Ambulatory Visit: Payer: Self-pay | Admitting: General Acute Care Hospital

## 2024-03-29 ENCOUNTER — Telehealth: Payer: Self-pay

## 2024-03-29 DIAGNOSIS — Z1231 Encounter for screening mammogram for malignant neoplasm of breast: Secondary | ICD-10-CM

## 2024-03-29 NOTE — Telephone Encounter (Signed)
 Received a message that the patient would like to set up an appointment.     Called and left a message for the patient to call back.

## 2024-03-30 ENCOUNTER — Ambulatory Visit (INDEPENDENT_AMBULATORY_CARE_PROVIDER_SITE_OTHER)

## 2024-03-30 DIAGNOSIS — I10 Essential (primary) hypertension: Secondary | ICD-10-CM

## 2024-03-30 DIAGNOSIS — E785 Hyperlipidemia, unspecified: Secondary | ICD-10-CM

## 2024-04-05 ENCOUNTER — Encounter (INDEPENDENT_AMBULATORY_CARE_PROVIDER_SITE_OTHER): Admitting: Family

## 2024-04-28 ENCOUNTER — Ambulatory Visit (INDEPENDENT_AMBULATORY_CARE_PROVIDER_SITE_OTHER): Payer: Self-pay

## 2024-04-28 DIAGNOSIS — E785 Hyperlipidemia, unspecified: Secondary | ICD-10-CM

## 2024-04-28 DIAGNOSIS — I10 Essential (primary) hypertension: Secondary | ICD-10-CM

## 2024-04-29 ENCOUNTER — Other Ambulatory Visit (INDEPENDENT_AMBULATORY_CARE_PROVIDER_SITE_OTHER): Payer: Self-pay | Admitting: Family

## 2024-04-29 DIAGNOSIS — I10 Essential (primary) hypertension: Secondary | ICD-10-CM

## 2024-06-01 ENCOUNTER — Ambulatory Visit (INDEPENDENT_AMBULATORY_CARE_PROVIDER_SITE_OTHER): Payer: Self-pay

## 2024-06-01 ENCOUNTER — Encounter (INDEPENDENT_AMBULATORY_CARE_PROVIDER_SITE_OTHER): Admitting: Family

## 2024-06-01 DIAGNOSIS — E785 Hyperlipidemia, unspecified: Secondary | ICD-10-CM

## 2024-06-01 DIAGNOSIS — I10 Essential (primary) hypertension: Secondary | ICD-10-CM

## 2024-06-03 ENCOUNTER — Other Ambulatory Visit (INDEPENDENT_AMBULATORY_CARE_PROVIDER_SITE_OTHER): Payer: Self-pay | Admitting: Family

## 2024-06-03 DIAGNOSIS — I10 Essential (primary) hypertension: Secondary | ICD-10-CM

## 2024-06-17 ENCOUNTER — Other Ambulatory Visit (INDEPENDENT_AMBULATORY_CARE_PROVIDER_SITE_OTHER): Payer: Self-pay | Admitting: Family Medicine

## 2024-06-17 DIAGNOSIS — E7800 Pure hypercholesterolemia, unspecified: Secondary | ICD-10-CM

## 2024-06-17 MED ORDER — PRAVASTATIN SODIUM 40 MG PO TABS
40.0000 mg | ORAL_TABLET | Freq: Every evening | ORAL | 0 refills | Status: DC
Start: 2024-06-17 — End: 2024-06-23

## 2024-06-23 ENCOUNTER — Encounter (INDEPENDENT_AMBULATORY_CARE_PROVIDER_SITE_OTHER): Payer: Self-pay | Admitting: Family

## 2024-06-23 ENCOUNTER — Ambulatory Visit (INDEPENDENT_AMBULATORY_CARE_PROVIDER_SITE_OTHER): Admitting: Family

## 2024-06-23 VITALS — BP 137/74 | HR 77 | Temp 97.9°F | Ht 66.5 in | Wt 183.6 lb

## 2024-06-23 DIAGNOSIS — R7303 Prediabetes: Secondary | ICD-10-CM

## 2024-06-23 DIAGNOSIS — F5101 Primary insomnia: Secondary | ICD-10-CM

## 2024-06-23 DIAGNOSIS — Z0000 Encounter for general adult medical examination without abnormal findings: Secondary | ICD-10-CM

## 2024-06-23 DIAGNOSIS — H538 Other visual disturbances: Secondary | ICD-10-CM

## 2024-06-23 DIAGNOSIS — I10 Essential (primary) hypertension: Secondary | ICD-10-CM

## 2024-06-23 DIAGNOSIS — F419 Anxiety disorder, unspecified: Secondary | ICD-10-CM

## 2024-06-23 DIAGNOSIS — Z780 Asymptomatic menopausal state: Secondary | ICD-10-CM

## 2024-06-23 DIAGNOSIS — E7800 Pure hypercholesterolemia, unspecified: Secondary | ICD-10-CM

## 2024-06-23 DIAGNOSIS — Z79899 Other long term (current) drug therapy: Secondary | ICD-10-CM

## 2024-06-23 DIAGNOSIS — F32A Depression, unspecified: Secondary | ICD-10-CM

## 2024-06-23 LAB — COMPREHENSIVE METABOLIC PANEL
ALT: 20 U/L (ref <=55)
AST (SGOT): 29 U/L (ref <=41)
Albumin/Globulin Ratio: 1.1 (ref 0.9–2.2)
Albumin: 4 g/dL (ref 3.5–5.0)
Alkaline Phosphatase: 50 U/L (ref 37–117)
Anion Gap: 8 (ref 5.0–15.0)
BUN: 21 mg/dL (ref 7–21)
Bilirubin, Total: 0.3 mg/dL (ref 0.2–1.2)
CO2: 31 meq/L — ABNORMAL HIGH (ref 17–29)
Calcium: 9.2 mg/dL (ref 8.5–10.5)
Chloride: 101 meq/L (ref 99–111)
Creatinine: 0.9 mg/dL (ref 0.4–1.0)
GFR: >60 mL/min/1.73 m2 (ref >=60.0)
Globulin: 3.5 g/dL (ref 2.0–3.6)
Glucose: 80 mg/dL (ref 70–100)
Hemolysis Index: 10 {index}
Potassium: 4.2 meq/L (ref 3.5–5.3)
Protein, Total: 7.5 g/dL (ref 6.0–8.3)
Sodium: 140 meq/L (ref 135–145)

## 2024-06-23 LAB — LAB USE ONLY - CBC WITH DIFFERENTIAL
Absolute Basophils: 0.03 x10 3/uL (ref 0.00–0.08)
Absolute Eosinophils: 0.1 x10 3/uL (ref 0.00–0.44)
Absolute Immature Granulocytes: 0.02 x10 3/uL (ref 0.00–0.07)
Absolute Lymphocytes: 3.4 x10 3/uL — ABNORMAL HIGH (ref 0.42–3.22)
Absolute Monocytes: 0.58 x10 3/uL (ref 0.21–0.85)
Absolute Neutrophils: 2.8 x10 3/uL (ref 1.10–6.33)
Absolute nRBC: 0 x10 3/uL (ref <=0.00)
Basophils %: 0.4 %
Eosinophils %: 1.4 %
Hematocrit: 38.4 % (ref 34.7–43.7)
Hemoglobin: 11.9 g/dL (ref 11.4–14.8)
Immature Granulocytes %: 0.3 %
Lymphocytes %: 49.1 %
MCH: 26.9 pg (ref 25.1–33.5)
MCHC: 31 g/dL — ABNORMAL LOW (ref 31.5–35.8)
MCV: 86.9 fL (ref 78.0–96.0)
MPV: 9.8 fL (ref 8.9–12.5)
Monocytes %: 8.4 %
Neutrophils %: 40.4 %
Platelet Count: 274 x10 3/uL (ref 142–346)
Preliminary Absolute Neutrophil Count: 2.8 x10 3/uL (ref 1.10–6.33)
RBC: 4.42 x10 6/uL (ref 3.90–5.10)
RDW: 13 % (ref 11–15)
WBC: 6.93 x10 3/uL (ref 3.10–9.50)
nRBC %: 0 /100{WBCs} (ref <=0.0)

## 2024-06-23 LAB — URINALYSIS WITH MICROSCOPIC EXAM
Urine Bilirubin: NEGATIVE
Urine Glucose: NEGATIVE
Urine Ketones: NEGATIVE mg/dL
Urine Leukocyte Esterase: NEGATIVE
Urine Nitrite: NEGATIVE
Urine Protein: NEGATIVE
Urine Specific Gravity: 1.02 (ref 1.001–1.035)
Urine Urobilinogen: NORMAL mg/dL (ref 0.2–2.0)
Urine pH: 6.5 (ref 5.0–8.0)

## 2024-06-23 LAB — HEMOGLOBIN A1C
Average Estimated Glucose: 116.9 mg/dL
Hemoglobin A1C: 5.7 % — ABNORMAL HIGH (ref 4.6–5.6)

## 2024-06-23 LAB — LIPID PANEL
Cholesterol / HDL Ratio: 4.3 {index}
Cholesterol: 228 mg/dL — ABNORMAL HIGH (ref <=199)
HDL: 53 mg/dL (ref >=40)
LDL Calculated: 159 mg/dL — ABNORMAL HIGH (ref 0–99)
Triglycerides: 80 mg/dL (ref 34–149)
VLDL Calculated: 16 mg/dL (ref 10–40)

## 2024-06-23 LAB — THYROID STIMULATING HORMONE (TSH) WITH REFLEX TO FREE T4: TSH: 0.87 u[IU]/mL (ref 0.35–4.94)

## 2024-06-23 MED ORDER — PRAVASTATIN SODIUM 40 MG PO TABS
40.0000 mg | ORAL_TABLET | Freq: Every evening | ORAL | 1 refills | Status: DC
Start: 2024-06-23 — End: 2024-07-29

## 2024-06-23 MED ORDER — TRAZODONE HCL 50 MG PO TABS: 50.0000 mg | ORAL_TABLET | Freq: Every evening | ORAL | 0 refills | Status: DC | PRN

## 2024-06-23 MED ORDER — HYDROCHLOROTHIAZIDE 25 MG PO TABS
25.0000 mg | ORAL_TABLET | Freq: Every day | ORAL | 1 refills | Status: AC
Start: 2024-06-23 — End: ?

## 2024-06-23 NOTE — Progress Notes (Signed)
 [facility]  Medicare Wellness Visit               Megan White is a 68 y.o. female who presents today for the following Medicare Wellness Visit: Annual Wellness Visit - Subsequent      Health Risk Assessment     History of Present Illness  Megan White is a 68 year old female who presents for a Medicare wellness visit.    She completed her shingles vaccination series in 2021 at a [facility] in [place] . She recalls having a DEXA scan two years ago, but it is unclear if it was completed.    She is currently fasting and plans to take her blood pressure medication after eating. She experiences heartburn with Promethena, so she takes it in the afternoon.    She experiences bouts of depression, especially during cloudy and rainy weather, and has difficulty sleeping. Melatonin causes headaches, so she uses Tylenol PM. She has a family history of depression and has previously tried Wellbutrin  and Zoloft. She feels anxious several days a week, particularly in poor weather, with no suicidal ideation.    She experiences constipation despite a high-fiber diet and uses Miralax, which causes gas.    She had eye surgery in 2021 for scars and reports good vision but suspects an issue may be returning in her right eye. She does not have an eye doctor in her new location.    She is retired and [place], which she feels contributes to her depression due to less social interaction.     During the past month, how would you rate your general health?:  Good  Which of the following tasks can you do without assistance - drive or take the bus alone; shop for groceries or clothes; prepare your own meals; do your own housework/laundry; handle your own finances/pay bills; eat, bathe or get around your home?: Drive or take the bus alone, Shop for groceries or clothes, Prepare your own meals, Eat, bathe, dress or get around your home, Handle your own finances/pay bills,  Do your own housework/laundry  Which of the following problems have you been bothered by in the past month - dizzy when standing up; problems using the phone; feeling tired or fatigued; moderate or severe body pain?: Moderate or severe body pain, Feeling tired or fatigued  Do you exercise for about 20 minutes 3 or more days per week?:Yes  During the past month was someone available to help if you needed and wanted help?  For example, if you felt nervous, lonely, got sick and had to stay in bed, needed someone to talk to, needed help with daily chores or needed help just taking care of yourself.: Yes  Do you always wear a seat belt?: Yes  Do you have any trouble taking medications the way you have been told to take them?: No  Have you been given any information that can help you with keeping track of your medications?: No  Do you have trouble paying for your medications?: No  Hospitalizations   Hospitalization within past year: No    Screenings         12/15/2023 06/23/2024   Ambulatory Screenings   Falls Risk: Terrilee more than 2 times in past year N N   Falls Risk: Suffer any injuries? N N   Depression: PHQ2 Total Score  2   Depression: PHQ9 Total Score  4  Substance Use Disorder Screen:  Megan White  reports that she has never smoked. She has never used smokeless tobacco. She reports current alcohol use of about 2.0 - 4.0 standard drinks of alcohol per week. She reports that she does not use drugs.    Functional Ability/Level of Safety   Falls Risk/Home Safety Assessment:  Have you been given any information that can help you with hazards in your house, such as scatter rugs, furniture, etc?: No  Do you feel unsteady when standing or walking?: No  Do you worry about falling?: No  Have you fallen two or more times in the past year?: No  Did you suffer any injuries from your falls in the past year?: No    Home Safety:   Low Risk for Falls, Skid-resistant rugs/remove throw rugs, Clear pathways between rooms  , and Proper  lighting stairs/bathrooms/bedrooms    Hearing Assessment:  hearing within normal limits      Visual Acuity:     If no visual acuity exam above, the patient has declined the visual acuity exam portion of this encounter.    Exercise:   Moderate ( i.e. brisk walking ), exercises >4x/week, >60 minutes per day, and walking    Diet:  Diet: - consumes a well balanced diet    Activities of Daily Living   ADL's  Bathing: Independent  Dressing: Independent  Mobility: Independent  Transfer: Independent  Eating: Independent  Toileting: Independent    IADL's  Phone: Independent  Housekeeping: Independent  Laundry: Independent  Transportation: Independent  Medications: Independent  Finances: Independent    ADL assistance:   No assistance needed    Social Activities/Engagement   Frequency of Communication with Friends and Family:  very often    Frequency of Social Gatherings with Friends and Family:   very often    Advanced Care Planning   Discussion of Advance Directives:   Has an Scientist, water quality. A copy has not been provided. Requested to provide.     Exam   BP 137/74 (BP Site: Left arm, Patient Position: Sitting, Cuff Size: Large)   Pulse 77   Temp 97.9 F (36.6 C) (Temporal)   Ht 1.689 m (5' 6.5)   Wt 83.3 kg (183 lb 9.6 oz)   BMI 29.19 kg/m   Review of Systems   Constitutional: Negative.  Negative for fever.   HENT:  Negative for congestion.    Eyes: Negative.  Negative for blurred vision.   Respiratory: Negative.  Negative for cough.    Cardiovascular: Negative.    Gastrointestinal: Negative.  Negative for heartburn.   Genitourinary: Negative.  Negative for dysuria.   Musculoskeletal: Negative.  Negative for myalgias.   Neurological: Negative.  Negative for dizziness.   Endo/Heme/Allergies: Negative.    Psychiatric/Behavioral: Negative.     All other systems reviewed and are negative.     Physical Exam  Vitals reviewed.   Constitutional:       Appearance: Normal appearance.   HENT:      Head: Normocephalic and  atraumatic.      Nose: Nose normal.   Eyes:      Extraocular Movements: Extraocular movements intact.      Pupils: Pupils are equal, round, and reactive to light.   Cardiovascular:      Rate and Rhythm: Normal rate and regular rhythm.      Heart sounds: No murmur heard.  Pulmonary:      Effort: Pulmonary effort is normal.      Breath  sounds: Normal breath sounds.   Abdominal:      General: Abdomen is flat. Bowel sounds are normal.      Palpations: Abdomen is soft.   Musculoskeletal:         General: Normal range of motion.      Cervical back: Normal range of motion and neck supple.   Skin:     General: Skin is warm.      Capillary Refill: Capillary refill takes less than 2 seconds.   Neurological:      General: No focal deficit present.      Mental Status: She is alert and oriented to person, place, and time.   Psychiatric:         Mood and Affect: Mood normal.         Behavior: Behavior normal.         Judgment: Judgment normal.         Evaluation of Cognitive Function   Mood/affect: Appropriate  Appearance:  alert, well appearing, and in no distress  Family member/caregiver input: Not present in room      Mini-Cog Score:  > 3 points - negative screen for dementia    Assessment/Plan     Assessment & Plan  Medicare annual wellness visit, subsequent       Reviewed recommended screenings. Immunizations up to date. Recommend low carb low cholesterol diet. Exercise at least 150 minutes weekly. Maintain adequate hydration of 1-2L daily.   On long term drug therapy    Orders:    Comprehensive Metabolic Panel; Future    CBC with Differential (Order); Future    Lipid Panel; Future    Hemoglobin A1C; Future    Prediabetes    Orders:    Comprehensive Metabolic Panel; Future    Hemoglobin A1C; Future    Hypercholesterolemia    Orders:    Comprehensive Metabolic Panel; Future    Lipid Panel; Future    pravastatin  (PRAVACHOL ) 40 MG tablet; Take 1 tablet (40 mg) by mouth once every evening    Benign essential  hypertension    Orders:    Comprehensive Metabolic Panel; Future    CBC with Differential (Order); Future    hydroCHLOROthiazide  (HYDRODIURIL ) 25 MG tablet; Take 1 tablet (25 mg) by mouth once daily    Post-menopausal    Orders:    Dxa Bone Density Axial Skeleton; Future    Primary insomnia    Orders:    traZODone  (DESYREL ) 50 MG tablet; Take 1 tablet (50 mg) by mouth at bedtime as needed for Sleep    Anxiety and depression    Orders:    Referral to Adult Psychiatry & Behavioral Health ([place]); Future    Blurred vision           Assessment & Plan  Adult Wellness Visit  Completed shingles vaccination series in 2021. DEXA scan done two years ago, not documented. Scheduled for mammogram and will inquire about DEXA scan. Reports no recent falls, mostly independent. Good vision post-surgery in 2021. Due for routine eye exam.  - Update records for completed shingles vaccination.  - Provide referral for DEXA scan.  - Provide referral for eye exam.  - Order labs: kidney and liver function tests, anemia screen, A1c, cholesterol, thyroid  function.    Constipation  Chronic constipation despite hydration and fiber. Miralax helps but causes gas.  - Continue Miralax as needed.  - Consider increasing dietary fiber intake.    Depression and Anxiety  Intermittent depression and anxiety, related  to weather and social interaction. Family history of depression. Difficulty sleeping, melatonin causes headaches. Uses Tylenol PM but wakes frequently. Considering trazodone  for sleep. Hesitant about antidepressants. Considering psychiatrist evaluation.  - Prescribe trazodone  for sleep as needed.  - Provide referral for counseling or psychiatric evaluation.  - Monitor mood and anxiety, consider medication if symptoms worsen.    Hypertension  Blood pressure well-controlled on hydrochlorothiazide , not taken today.  - Refill hydrochlorothiazide  with 90-day supply and one refill.    Hyperlipidemia  On pravastatin , issues with pharmacy refills,  plans to address post-cruise.  - Refill pravastatin  with 90-day supply and one refill.     Personalized Prevention Plan   The patient was provided with a personalized prevention plan via   the Patient Instructions tab of this visit                                                                                                                                          History/Care Team   Patient Care Team:  Alto Roxy RAMAN, MD as PCP - General (Family Medicine)  Sanjuanita Derick SAUNDERS, MD as Consulting Physician (Critical Care Medicine)  Medical, surgical, family history reviewed and updated during this encounter  Medication list updated and reconciled during this encounter    Additional Documentation

## 2024-06-23 NOTE — Progress Notes (Signed)
 Have you seen any specialists since your last visit with us ?  No      The patient was informed that the following HM items are still outstanding:   mammogram, DXA scan, MAW

## 2024-06-23 NOTE — Assessment & Plan Note (Signed)
 Orders:    traZODone  (DESYREL ) 50 MG tablet; Take 1 tablet (50 mg) by mouth at bedtime as needed for Sleep

## 2024-06-23 NOTE — Assessment & Plan Note (Signed)
 Orders:    Comprehensive Metabolic Panel; Future    CBC with Differential (Order); Future    hydroCHLOROthiazide  (HYDRODIURIL ) 25 MG tablet; Take 1 tablet (25 mg) by mouth once daily

## 2024-06-23 NOTE — Assessment & Plan Note (Signed)
 Orders:    Comprehensive Metabolic Panel; Future    Lipid Panel; Future    pravastatin  (PRAVACHOL ) 40 MG tablet; Take 1 tablet (40 mg) by mouth once every evening

## 2024-06-25 ENCOUNTER — Encounter (INDEPENDENT_AMBULATORY_CARE_PROVIDER_SITE_OTHER): Payer: Self-pay

## 2024-06-25 ENCOUNTER — Ambulatory Visit (INDEPENDENT_AMBULATORY_CARE_PROVIDER_SITE_OTHER): Payer: Self-pay | Admitting: Family

## 2024-06-25 NOTE — Progress Notes (Signed)
 Hi Megan White:  Laboratory results from your recent office visit have returned for your review. (Minor variances, if any, outside of the expected range are not considered clinically significant)     - A1C  (3 month measure of glucose control ) is elevated at prediabetic levels.  This value has increased compared to prior values.  - (CBC) The complete blood count is within acceptable limits.  There is no evidence of anemia.  White blood cell count and platelet count are within normal limits.  - (CMP) Liver and kidney function are within acceptable limits.  Electrolytes and glucose are within acceptable limits.  - TSH ( a screen for thyroid  disease) is within normal limits.   - LDL (bad cholesterol) is moderately elevated.. I will recommend low-carb low-cholesterol diet and exercising at least 30 minutes daily.   Continue current medication regimen.  Reevaluate in 6 months and if continued elevation will may need to add additional medication for cholesterol management.  HDL (good cholesterol) is within normal limits..  Triglycerides are within normal limits.   - The urinalysis is within acceptable limits.  There is no evidence of clinically significant amounts of white blood cells, blood, or protein in the urine.  -  If you have further questions or concerns please follow-up with clinic.    Regards,  Milo Almarie Newport, FNP

## 2024-06-29 ENCOUNTER — Ambulatory Visit

## 2024-07-05 ENCOUNTER — Encounter (INDEPENDENT_AMBULATORY_CARE_PROVIDER_SITE_OTHER): Payer: Self-pay | Admitting: Family Medicine

## 2024-07-18 ENCOUNTER — Other Ambulatory Visit (INDEPENDENT_AMBULATORY_CARE_PROVIDER_SITE_OTHER): Payer: Self-pay | Admitting: Family

## 2024-07-18 DIAGNOSIS — F5101 Primary insomnia: Secondary | ICD-10-CM

## 2024-07-19 ENCOUNTER — Ambulatory Visit
Admission: RE | Admit: 2024-07-19 | Discharge: 2024-07-19 | Disposition: A | Discharge status: Home / Self Care | Source: Ambulatory Visit | Attending: General Acute Care Hospital | Admitting: General Acute Care Hospital

## 2024-07-19 DIAGNOSIS — Z1231 Encounter for screening mammogram for malignant neoplasm of breast: Secondary | ICD-10-CM | POA: Insufficient documentation

## 2024-07-23 ENCOUNTER — Encounter (INDEPENDENT_AMBULATORY_CARE_PROVIDER_SITE_OTHER): Payer: Self-pay

## 2024-07-24 ENCOUNTER — Encounter (INDEPENDENT_AMBULATORY_CARE_PROVIDER_SITE_OTHER): Payer: Self-pay

## 2024-07-24 ENCOUNTER — Ambulatory Visit (INDEPENDENT_AMBULATORY_CARE_PROVIDER_SITE_OTHER): Admitting: Gastroenterology

## 2024-07-24 VITALS — BP 160/93 | HR 61 | Temp 97.7°F | Ht 66.5 in | Wt 186.0 lb

## 2024-07-24 DIAGNOSIS — R0789 Other chest pain: Secondary | ICD-10-CM

## 2024-07-24 DIAGNOSIS — I10 Essential (primary) hypertension: Secondary | ICD-10-CM

## 2024-07-24 DIAGNOSIS — R202 Paresthesia of skin: Secondary | ICD-10-CM

## 2024-07-24 DIAGNOSIS — R42 Dizziness and giddiness: Secondary | ICD-10-CM

## 2024-07-24 LAB — ABBOTT BINAX NOW COVID-19 ANTIGEN: BinaxNOW SARS COV2 Antigen POCT: NEGATIVE

## 2024-07-24 NOTE — Progress Notes (Signed)
 [facility]  OFFICE NOTE         Subjective   Historian: Patient      Chief Complaint   Patient presents with    Dizziness     Pt c/o lightheadedness/dizzy, c/o BP high at home, since in the morning.        Dizziness      Megan White is a 68 y.o. female who presents for lightheaded and altered sensations on the left side of the arm and the back.  Patient has a history of hypertension and takes hydrochlorothiazide  25 mg daily.  Patient noticed her blood pressure has been fluctuate between 170s to 140 asystolically.  Patient states chest is comfortable but no pain.  Patient states altered sensations in the arm and back could be from the position of sleep. denies weakness, gait issues, fever, chills, vertigo, shortness of breath, nausea, vomiting,     History:  Medications and Allergies reviewed.   Pertinent Past Medical, Surgical, Family and Social History were reviewed.          Objective     Vitals:    07/24/24 1046 07/24/24 1050   BP: (!) 174/104 (!) 160/93   BP Site: Right arm Left arm   Patient Position: Sitting Sitting   Cuff Size: Medium Large   Pulse: 61    Temp: 97.7 F (36.5 C)    TempSrc: Tympanic    SpO2: 99%    Weight: 84.4 kg (186 lb)    Height: 1.689 m (5' 6.5)      Physical Exam  Constitutional:       Appearance: Normal appearance.   Cardiovascular:      Rate and Rhythm: Normal rate and regular rhythm.      Pulses: Normal pulses.      Heart sounds: Normal heart sounds.   Pulmonary:      Effort: Pulmonary effort is normal. No respiratory distress.      Breath sounds: Normal breath sounds. No wheezing, rhonchi or rales.   Neurological:      General: No focal deficit present.      Mental Status: She is alert and oriented to person, place, and time.      Cranial Nerves: No cranial nerve deficit.      Sensory: Sensory deficit present.      Motor: No weakness.      Coordination: Coordination normal.      Gait: Gait normal.     Urgent Care Course   LABS  The following POCT tests were ordered,  reviewed and discussed with the patient/family.     Results for orders placed or performed in visit on 07/24/24 (from the past 24 hours)   BinaxNow SARS-COV-2 Antigen POCT    Collection Time: 07/24/24 11:10 AM   Result Value    BinaxNOW SARS COV2 Antigen POCT Negative     There were no x-rays reviewed with this patient during the visit.      Procedures   EKG Read    Date/Time: 07/24/2024 11:42 AM    Performed by: Leora Crocker, FNP  Authorized by: Salisha Bardsley, FNP    Previous ECG:     Previous ECG:  Compared to current    Comparison ECG info:  08/23    Similarity:  No change  Interpretation:     Interpretation: normal    Rhythm:     Rhythm: sinus rhythm    Ectopy:     Ectopy: none    QRS:  QRS axis:  Normal  ST segments:     ST segments:  Normal  T waves:     T waves: normal         Assessment / Plan     Differential Diagnoses including but not limited to: CAD, GERD, Chest wall pain, Costochondritis, Pneumonia, Pulmonary Embolism, Panic Attack, Aortic dissection, Heart failure and Pericarditis.   Given patient age, African-American, history of hypertension and current symptoms  - referred patient to be seen by cardiologist.    - discussed with the patient about life-threatening symptoms the patient may be evaluated by emergency room.  Patient verbalized understanding.    - Advised patient monitor blood pressure at home daily and record them   - Follow-up with PCP for medication adjustment for uncontrolled hypertension      Megan White was seen today for dizziness.    Diagnoses and all orders for this visit:    Uncontrolled hypertension    Lightheaded  -     BinaxNow SARS-COV-2 Antigen POCT; Future  -     BinaxNow SARS-COV-2 Antigen POCT  -     Task for ECG    Paresthesia of left arm    Chest discomfort  -     Referral to Cardiology ([facility] [facility]); Future         The indications for early follow-up with PCP and return to UC were discussed. Patient/family received education on the working diagnosis, diagnostic uncertainties,  and proposed treatment plan. Indications for emergency evaluation in the ED were reviewed. Written and verbal discharge instructions were provided and discussed and all questions from the patient/family were addressed, with no apparent barriers.

## 2024-07-26 ENCOUNTER — Telehealth (INDEPENDENT_AMBULATORY_CARE_PROVIDER_SITE_OTHER): Payer: Self-pay

## 2024-07-26 NOTE — Telephone Encounter (Signed)
 Contacted patient to schedule a Cardiology consult as a new patient based on referral order. No answer, left a voice message.

## 2024-07-29 ENCOUNTER — Encounter (INDEPENDENT_AMBULATORY_CARE_PROVIDER_SITE_OTHER): Payer: Self-pay | Admitting: Cardiovascular Disease

## 2024-07-29 ENCOUNTER — Ambulatory Visit (INDEPENDENT_AMBULATORY_CARE_PROVIDER_SITE_OTHER): Admitting: Cardiovascular Disease

## 2024-07-29 VITALS — BP 131/76 | HR 68 | Ht 66.5 in | Wt 186.0 lb

## 2024-07-29 DIAGNOSIS — R0789 Other chest pain: Secondary | ICD-10-CM

## 2024-07-29 DIAGNOSIS — E7800 Pure hypercholesterolemia, unspecified: Secondary | ICD-10-CM

## 2024-07-29 DIAGNOSIS — I10 Essential (primary) hypertension: Secondary | ICD-10-CM

## 2024-07-29 MED ORDER — PRAVASTATIN SODIUM 80 MG PO TABS
80.0000 mg | ORAL_TABLET | Freq: Every evening | ORAL | Status: DC
Start: 2024-07-29 — End: 2024-07-29

## 2024-07-29 MED ORDER — ROSUVASTATIN CALCIUM 20 MG PO TABS
20.0000 mg | ORAL_TABLET | Freq: Every day | ORAL | 1 refills | Status: AC
Start: 2024-07-29 — End: ?
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
## 2024-09-08 ENCOUNTER — Ambulatory Visit (INDEPENDENT_AMBULATORY_CARE_PROVIDER_SITE_OTHER): Payer: Self-pay

## 2024-09-08 DIAGNOSIS — I10 Essential (primary) hypertension: Secondary | ICD-10-CM

## 2024-09-08 DIAGNOSIS — E785 Hyperlipidemia, unspecified: Secondary | ICD-10-CM

## 2024-09-15 ENCOUNTER — Ambulatory Visit (INDEPENDENT_AMBULATORY_CARE_PROVIDER_SITE_OTHER)

## 2024-09-15 DIAGNOSIS — R0789 Other chest pain: Secondary | ICD-10-CM

## 2024-09-19 LAB — CARDIAC STRESS TEST

## 2024-09-20 ENCOUNTER — Ambulatory Visit (INDEPENDENT_AMBULATORY_CARE_PROVIDER_SITE_OTHER): Payer: Self-pay | Admitting: Cardiovascular Disease

## 2024-09-30 ENCOUNTER — Ambulatory Visit (INDEPENDENT_AMBULATORY_CARE_PROVIDER_SITE_OTHER): Payer: Self-pay

## 2024-09-30 DIAGNOSIS — E785 Hyperlipidemia, unspecified: Secondary | ICD-10-CM

## 2024-09-30 DIAGNOSIS — I10 Essential (primary) hypertension: Secondary | ICD-10-CM

## 2024-10-29 ENCOUNTER — Ambulatory Visit (INDEPENDENT_AMBULATORY_CARE_PROVIDER_SITE_OTHER): Payer: Self-pay

## 2024-10-29 DIAGNOSIS — I10 Essential (primary) hypertension: Secondary | ICD-10-CM

## 2024-10-29 DIAGNOSIS — E785 Hyperlipidemia, unspecified: Secondary | ICD-10-CM

## 2024-11-10 ENCOUNTER — Encounter (INDEPENDENT_AMBULATORY_CARE_PROVIDER_SITE_OTHER): Payer: Self-pay | Admitting: Family Medicine

## 2024-11-10 NOTE — Progress Notes (Signed)
 From [employer]:    On 09/30/2024 a Patient Care Coordinator spoke with patient Megan White DOB: Jan 26, 1956. Patient stated that her cholesterol medication was recently changed from Pravastatin  to Rosuvastatin . The Rosuvastatin  gave her headaches, so she switched back to her previous dose of Pravastatin . She is currently taking Pravastatin  40 mg 1 tablet in the afternoon. She reported she takes this around 3-4 pm, because if she takes it later, she gets heartburn. We wanted to notify the provider of this call. Please confirm receipt and provide an update on any next steps initiated, if necessary.

## 2024-12-29 ENCOUNTER — Encounter (INDEPENDENT_AMBULATORY_CARE_PROVIDER_SITE_OTHER): Admitting: Family Medicine
